# Patient Record
Sex: Male | Born: 1955 | Race: White | Hispanic: No | Marital: Married | Smoking: Never smoker
Health system: Southern US, Community
[De-identification: ages and names within clinical notes are randomized; demographics above are authoritative.]

## PROBLEM LIST (undated history)

## (undated) DIAGNOSIS — H33002 Unspecified retinal detachment with retinal break, left eye: Secondary | ICD-10-CM

## (undated) DIAGNOSIS — J309 Allergic rhinitis, unspecified: Secondary | ICD-10-CM

## (undated) DIAGNOSIS — N4340 Spermatocele of epididymis, unspecified: Secondary | ICD-10-CM

## (undated) DIAGNOSIS — Z87898 Personal history of other specified conditions: Secondary | ICD-10-CM

## (undated) DIAGNOSIS — H3320 Serous retinal detachment, unspecified eye: Secondary | ICD-10-CM

## (undated) DIAGNOSIS — T7840XA Allergy, unspecified, initial encounter: Secondary | ICD-10-CM

## (undated) DIAGNOSIS — I447 Left bundle-branch block, unspecified: Secondary | ICD-10-CM

## (undated) DIAGNOSIS — H269 Unspecified cataract: Secondary | ICD-10-CM

## (undated) DIAGNOSIS — Z9889 Other specified postprocedural states: Secondary | ICD-10-CM

## (undated) DIAGNOSIS — I2510 Atherosclerotic heart disease of native coronary artery without angina pectoris: Secondary | ICD-10-CM

## (undated) DIAGNOSIS — K1321 Leukoplakia of oral mucosa, including tongue: Secondary | ICD-10-CM

## (undated) DIAGNOSIS — M1990 Unspecified osteoarthritis, unspecified site: Secondary | ICD-10-CM

## (undated) DIAGNOSIS — M2021 Hallux rigidus, right foot: Secondary | ICD-10-CM

## (undated) DIAGNOSIS — M2020 Hallux rigidus, unspecified foot: Secondary | ICD-10-CM

## (undated) DIAGNOSIS — K219 Gastro-esophageal reflux disease without esophagitis: Secondary | ICD-10-CM

## (undated) DIAGNOSIS — Z86010 Personal history of colonic polyps: Principal | ICD-10-CM

## (undated) DIAGNOSIS — R931 Abnormal findings on diagnostic imaging of heart and coronary circulation: Secondary | ICD-10-CM

## (undated) DIAGNOSIS — R109 Unspecified abdominal pain: Secondary | ICD-10-CM

## (undated) DIAGNOSIS — H9191 Unspecified hearing loss, right ear: Secondary | ICD-10-CM

## (undated) DIAGNOSIS — I341 Nonrheumatic mitral (valve) prolapse: Secondary | ICD-10-CM

## (undated) DIAGNOSIS — R112 Nausea with vomiting, unspecified: Secondary | ICD-10-CM

## (undated) HISTORY — DX: Personal history of colonic polyps: Z86.010

## (undated) HISTORY — DX: Nonrheumatic mitral (valve) prolapse: I34.1

## (undated) HISTORY — DX: Left bundle-branch block, unspecified: I44.7

## (undated) HISTORY — PX: WISDOM TOOTH EXTRACTION: SHX21

## (undated) HISTORY — DX: Unspecified hearing loss, right ear: H91.91

## (undated) HISTORY — DX: Spermatocele of epididymis, unspecified: N43.40

## (undated) HISTORY — DX: Personal history of other specified conditions: Z87.898

## (undated) HISTORY — DX: Hallux rigidus, right foot: M20.21

## (undated) HISTORY — DX: Serous retinal detachment, unspecified eye: H33.20

## (undated) HISTORY — DX: Gastro-esophageal reflux disease without esophagitis: K21.9

## (undated) HISTORY — DX: Atherosclerotic heart disease of native coronary artery without angina pectoris: I25.10

## (undated) HISTORY — PX: RETINAL DETACHMENT SURGERY: SHX105

## (undated) HISTORY — DX: Abnormal findings on diagnostic imaging of heart and coronary circulation: R93.1

## (undated) HISTORY — PX: COLONOSCOPY: SHX174

## (undated) HISTORY — DX: Hallux rigidus, unspecified foot: M20.20

## (undated) HISTORY — PX: ORTHOPEDIC SURGERY: SHX850

## (undated) HISTORY — DX: Unspecified cataract: H26.9

## (undated) HISTORY — DX: Unspecified abdominal pain: R10.9

## (undated) HISTORY — DX: Allergy, unspecified, initial encounter: T78.40XA

## (undated) HISTORY — DX: Allergic rhinitis, unspecified: J30.9

## (undated) HISTORY — PX: UPPER GASTROINTESTINAL ENDOSCOPY: SHX188

## (undated) HISTORY — DX: Leukoplakia of oral mucosa, including tongue: K13.21

## (undated) HISTORY — DX: Unspecified osteoarthritis, unspecified site: M19.90

---

## 1998-01-12 ENCOUNTER — Emergency Department (HOSPITAL_COMMUNITY): Admission: EM | Admit: 1998-01-12 | Discharge: 1998-01-12 | Payer: Self-pay | Admitting: Emergency Medicine

## 1999-07-27 HISTORY — PX: OTHER SURGICAL HISTORY: SHX169

## 1999-12-04 ENCOUNTER — Encounter: Payer: Self-pay | Admitting: Oral and Maxillofacial Surgery

## 1999-12-09 ENCOUNTER — Observation Stay (HOSPITAL_COMMUNITY): Admission: RE | Admit: 1999-12-09 | Discharge: 1999-12-10 | Payer: Self-pay | Admitting: Oral and Maxillofacial Surgery

## 2003-08-18 ENCOUNTER — Emergency Department (HOSPITAL_COMMUNITY): Admission: AD | Admit: 2003-08-18 | Discharge: 2003-08-18 | Payer: Self-pay | Admitting: Internal Medicine

## 2005-02-01 ENCOUNTER — Ambulatory Visit (HOSPITAL_COMMUNITY): Admission: RE | Admit: 2005-02-01 | Discharge: 2005-02-01 | Payer: Self-pay | Admitting: Gastroenterology

## 2012-04-25 HISTORY — PX: OTHER SURGICAL HISTORY: SHX169

## 2012-05-08 ENCOUNTER — Encounter (INDEPENDENT_AMBULATORY_CARE_PROVIDER_SITE_OTHER): Payer: PRIVATE HEALTH INSURANCE | Admitting: Ophthalmology

## 2012-05-08 DIAGNOSIS — H43819 Vitreous degeneration, unspecified eye: Secondary | ICD-10-CM

## 2012-05-08 DIAGNOSIS — H2510 Age-related nuclear cataract, unspecified eye: Secondary | ICD-10-CM

## 2012-05-08 DIAGNOSIS — H33309 Unspecified retinal break, unspecified eye: Secondary | ICD-10-CM

## 2012-05-08 DIAGNOSIS — H35439 Paving stone degeneration of retina, unspecified eye: Secondary | ICD-10-CM

## 2012-05-22 ENCOUNTER — Ambulatory Visit (INDEPENDENT_AMBULATORY_CARE_PROVIDER_SITE_OTHER): Payer: PRIVATE HEALTH INSURANCE | Admitting: Ophthalmology

## 2012-05-22 DIAGNOSIS — H33309 Unspecified retinal break, unspecified eye: Secondary | ICD-10-CM

## 2012-09-22 ENCOUNTER — Ambulatory Visit (INDEPENDENT_AMBULATORY_CARE_PROVIDER_SITE_OTHER): Payer: PRIVATE HEALTH INSURANCE | Admitting: Ophthalmology

## 2012-10-04 ENCOUNTER — Ambulatory Visit (INDEPENDENT_AMBULATORY_CARE_PROVIDER_SITE_OTHER): Payer: PRIVATE HEALTH INSURANCE | Admitting: Ophthalmology

## 2012-10-04 DIAGNOSIS — H2510 Age-related nuclear cataract, unspecified eye: Secondary | ICD-10-CM

## 2012-10-04 DIAGNOSIS — H33309 Unspecified retinal break, unspecified eye: Secondary | ICD-10-CM

## 2012-10-04 DIAGNOSIS — H43819 Vitreous degeneration, unspecified eye: Secondary | ICD-10-CM

## 2013-01-12 ENCOUNTER — Encounter: Payer: Self-pay | Admitting: Internal Medicine

## 2013-02-20 ENCOUNTER — Ambulatory Visit (INDEPENDENT_AMBULATORY_CARE_PROVIDER_SITE_OTHER): Payer: 59 | Admitting: Internal Medicine

## 2013-02-20 ENCOUNTER — Encounter: Payer: Self-pay | Admitting: Internal Medicine

## 2013-02-20 VITALS — BP 104/72 | HR 64 | Ht 71.5 in | Wt 168.8 lb

## 2013-02-20 DIAGNOSIS — K219 Gastro-esophageal reflux disease without esophagitis: Secondary | ICD-10-CM

## 2013-02-20 NOTE — Progress Notes (Addendum)
Subjective:    Patient ID: Willie Hernandez, male    DOB: 11/06/1955, 57 y.o.   MRN: [id number]  HPI The patient is here at the request of Dr. Valentina Lucks because of a > 5 year history of heartburn and reflux. He has used PPI and H2 blockers over the years and is able to reduce symptoms with dietary modifications as well. He does have frequent symptoms at times over the years. He does not have dysphagia or unintentional weight loss.   Allergies  Allergen Reactions  . Doxycycline Hyclate     Sores in mouth  . Sulfa Antibiotics Hives    flushing   Outpatient Prescriptions Prior to Visit  Medication Sig Dispense Refill  . cholecalciferol (VITAMIN D) 1000 UNITS tablet Take 1,000 Units by mouth daily.      . fexofenadine (ALLEGRA) 60 MG tablet Take 60 mg by mouth 2 (two) times daily.      . Flaxseed, Linseed, 1000 MG CAPS Take by mouth.      . Multiple Vitamin (MULTIVITAMIN) tablet Take 1 tablet by mouth daily.      . pantoprazole (PROTONIX) 40 MG tablet Take 40 mg by mouth daily as needed.      . ranitidine (ZANTAC) 150 MG tablet Take 150 mg by mouth daily.      . fluticasone (FLONASE) 50 MCG/ACT nasal spray Place 2 sprays into the nose daily.       No facility-administered medications prior to visit.   Past Medical History  Diagnosis Date  . GERD (gastroesophageal reflux disease)   . Allergic rhinitis   . Mitral valve prolapse     mild mitral regurgitation  . Hearing loss in right ear     noise exposure  . Spermatocele     Right, Dr. Isabel Caprice  . Hallux rigidus     with heel cord tightness, Dr. Lajoyce Corners   Past Surgical History  Procedure Laterality Date  . Corrective jaw surgery  2001    for overbite  . Laser retinal tears os  04/2012   History   Social History  . Marital Status: Married    Spouse Name: Elease Hashimoto    Number of Children: 2  . Years of Education: JD   Occupational History  . 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    Social History Main Topics  . Smoking status: Never Smoker    . Smokeless tobacco: Never Used  . Alcohol Use: Yes     Comment: occ.  . Drug Use: No  .       Family History  Problem Relation Age of Onset  . Atrial fibrillation Father   . Sudden death Father   . Congestive Heart Failure Mother   . Alzheimer's disease Mother   . Esophageal cancer      uncle  . Colon cancer Paternal Grandfather     Review of Systems + seasonal allergies and decreased hearing in right ear All other ROS negative    Objective:   Physical Exam General:  NAD Eyes:   anicteric Lungs:  clear Heart:  S1S2 no rubs, murmurs or gallops Abdomen:  soft and nontender  Data Reviewed:   PCP notes Says he is due for colonoscopy in 2016 - records requested patient thinks last colonoscopy < 5 years ago and no hx polyps  Records review shows last in 2006 and was normal Will need recall for 01/2015 Assessment & Plan:  GERD (gastroesophageal reflux disease)   1. Given that he is a white male  in 6th decade and has chronic GERD screening for Barrett's is reasonable so will perform EGD 2. The risks and benefits as well as alternatives of endoscopic procedure(s) have been discussed and reviewed. All questions answered. The patient agrees to proceed.  I appreciate the opportunity to care for this patient.  ZO:[email or web address],WUJW Jomarie Longs, MD

## 2013-02-20 NOTE — Patient Instructions (Addendum)
You have been scheduled for an endoscopy with propofol. Please follow written instructions given to you at your visit today. If you use inhalers (even only as needed), please bring them with you on the day of your procedure. Your physician has requested that you go to www.[email or web address].com and enter the access code given to you at your visit today. This web site gives a general overview about your procedure. However, you should still follow specific instructions given to you by our office regarding your preparation for the procedure.  I appreciate the opportunity to care for you.  

## 2013-02-21 ENCOUNTER — Encounter: Payer: Self-pay | Admitting: Internal Medicine

## 2013-03-23 ENCOUNTER — Encounter: Payer: Self-pay | Admitting: Internal Medicine

## 2013-03-23 ENCOUNTER — Ambulatory Visit (AMBULATORY_SURGERY_CENTER): Payer: 59 | Admitting: Internal Medicine

## 2013-03-23 VITALS — BP 120/57 | HR 48 | Temp 98.3°F | Resp 23 | Ht 71.0 in | Wt 168.0 lb

## 2013-03-23 DIAGNOSIS — K219 Gastro-esophageal reflux disease without esophagitis: Secondary | ICD-10-CM

## 2013-03-23 MED ORDER — SODIUM CHLORIDE 0.9 % IV SOLN: 500.0000 mL | INTRAVENOUS | Status: DC

## 2013-03-23 NOTE — Progress Notes (Signed)
Called to room to assist during endoscopic procedure.  Patient ID and intended procedure confirmed with present staff. Received instructions for my participation in the procedure from the performing physician.  

## 2013-03-23 NOTE — Patient Instructions (Addendum)
YOU HAD AN ENDOSCOPIC PROCEDURE TODAY AT THE [facility]: Refer to the procedure report that was given to you for any specific questions about what was found during the examination.  If the procedure report does not answer your questions, please call your gastroenterologist to clarify.  If you requested that your care partner not be given the details of your procedure findings, then the procedure report has been included in a sealed envelope for you to review at your convenience later.  YOU SHOULD EXPECT: Some feelings of bloating in the abdomen. Passage of more gas than usual.  Walking can help get rid of the air that was put into your GI tract during the procedure and reduce the bloating. If you had a lower endoscopy (such as a colonoscopy or flexible sigmoidoscopy) you may notice spotting of blood in your stool or on the toilet paper. If you underwent a bowel prep for your procedure, then you may not have a normal bowel movement for a few days.  DIET: Your first meal following the procedure should be a light meal and then it is ok to progress to your normal diet.  A half-sandwich or bowl of soup is an example of a good first meal.  Heavy or fried foods are harder to digest and may make you feel nauseous or bloated.  Likewise meals heavy in dairy and vegetables can cause extra gas to form and this can also increase the bloating.  Drink plenty of fluids but you should avoid alcoholic beverages for 24 hours.  ACTIVITY: Your care partner should take you home directly after the procedure.  You should plan to take it easy, moving slowly for the rest of the day.  You can resume normal activity the day after the procedure however you should NOT DRIVE or use heavy machinery for 24 hours (because of the sedation medicines used during the test).    SYMPTOMS TO REPORT IMMEDIATELY: A gastroenterologist can be reached at any hour.  During normal business hours, 8:30 AM to 5:00 PM Monday through  Friday, call [phone].  After hours and on weekends, please call the GI answering service at [phone] who will take a message and have the physician on call contact you.   Following upper endoscopy (EGD)  Vomiting of blood or coffee ground material  New chest pain or pain under the shoulder blades  Painful or persistently difficult swallowing  New shortness of breath  Fever of 100F or higher  Black, tarry-looking stools  FOLLOW UP: If any biopsies were taken you will be contacted by phone or by letter within the next 1-3 weeks.  Call your gastroenterologist if you have not heard about the biopsies in 3 weeks.  Our staff will call the home number listed on your records the next business day following your procedure to check on you and address any questions or concerns that you may have at that time regarding the information given to you following your procedure. This is a courtesy call and so if there is no answer at the home number and we have not heard from you through the emergency physician on call, we will assume that you have returned to your regular daily activities without incident.  SIGNATURES/CONFIDENTIALITY: You and/or your care partner have signed paperwork which will be entered into your electronic medical record.  These signatures attest to the fact that that the information above on your After Visit Summary has been reviewed and is understood.  Full responsibility of the confidentiality of this discharge information lies with you and/or your care-partner.There was a tiny (2-3 mm) area of possible Barrett's esophagus. The alternative is this is just the way you are built. Whatever the case the cancer risk would be extremely small. If there is Barrett's tissue then I would recommend rechecking in 3-5 years.  Remainder of exam normal.  I will call with the results when they return - you should hear by late next week.  I appreciate the opportunity to care for you. Iva Boop, MD, [facility]

## 2013-03-23 NOTE — Op Note (Signed)
[facility] [address] [place], [address]   ENDOSCOPY PROCEDURE REPORT  PATIENT: Willie, Hernandez  MR#: [id number] BIRTHDATE: [birth year] , 57  yrs. old GENDER: Male ENDOSCOPIST: Iva Boop, MD, [facility] PROCEDURE DATE:  03/23/2013 PROCEDURE:  EGD w/ biopsy ASA CLASS:     Class II INDICATIONS:  Heartburn.   Barrett's screening. MEDICATIONS: propofol (Diprivan) 150mg  IV, MAC sedation, administered by CRNA, and These medications were titrated to patient response per physician's verbal order TOPICAL ANESTHETIC: Cetacaine Spray  DESCRIPTION OF PROCEDURE: After the risks benefits and alternatives of the procedure were thoroughly explained, informed consent was obtained.  The LB [id number] endoscope was introduced through the mouth and advanced to the second portion of the duodenum. Without limitations.  The instrument was slowly withdrawn as the mucosa was fully examined.        ESOPHAGUS: There was evidence of suspected Barrett's esophagus at the gastroesophageal junction. 2-3 mm tongue vs. irregular Z-line (at 41 cm). Multiple biopsies were performed using cold forceps. Sample sent for histology.  The remainder of the upper endoscopy exam was otherwise normal. Retroflexed views revealed ? Barrett's.     The scope was then withdrawn from the patient and the procedure completed.  COMPLICATIONS: There were no complications. ENDOSCOPIC IMPRESSION: 1.   There was evidence of suspected Barrett's esophagus; multiple biopsies 2.   The remainder of the upper endoscopy exam was otherwise normal  RECOMMENDATIONS: Office will call with results   eSigned:  Iva Boop, MD, [facility] 03/23/2013 4:43 PM   VO:ZDGU Valentina Lucks, MD and The Patient

## 2013-03-23 NOTE — Progress Notes (Signed)
Patient did not have preoperative order for IV antibiotic SSI prophylaxis. (G8918)  Patient did not experience any of the following events: a burn prior to discharge; a fall within the facility; wrong site/side/patient/procedure/implant event; or a hospital transfer or hospital admission upon discharge from the facility. (G8907)  

## 2013-03-27 ENCOUNTER — Telehealth: Payer: Self-pay | Admitting: *Deleted

## 2013-03-27 NOTE — Telephone Encounter (Signed)
  Follow up Call-  Call back number 03/23/2013  Post procedure Call Back phone  # [phone]  Permission to leave phone message Yes     Patient questions:  Message left to call us if necessary.

## 2013-04-04 ENCOUNTER — Other Ambulatory Visit: Payer: Self-pay | Admitting: Internal Medicine

## 2013-04-04 MED ORDER — PANTOPRAZOLE SODIUM 20 MG PO TBEC: 20.0000 mg | DELAYED_RELEASE_TABLET | Freq: Every day | ORAL | Status: DC

## 2013-04-04 NOTE — Progress Notes (Signed)
Quick Note:  Results given to patient - mild reflux changes - no Barrett's Will stay on PPI to try to go to 20 mg daily pantoprazole No recall/letter ______

## 2014-11-08 ENCOUNTER — Encounter (INDEPENDENT_AMBULATORY_CARE_PROVIDER_SITE_OTHER): Payer: 59 | Admitting: Ophthalmology

## 2014-11-08 DIAGNOSIS — H43813 Vitreous degeneration, bilateral: Secondary | ICD-10-CM | POA: Diagnosis not present

## 2014-11-08 DIAGNOSIS — H33302 Unspecified retinal break, left eye: Secondary | ICD-10-CM

## 2014-11-08 DIAGNOSIS — H2513 Age-related nuclear cataract, bilateral: Secondary | ICD-10-CM | POA: Diagnosis not present

## 2015-04-01 ENCOUNTER — Encounter: Payer: Self-pay | Admitting: Internal Medicine

## 2016-03-25 ENCOUNTER — Encounter: Payer: Self-pay | Admitting: Internal Medicine

## 2016-06-28 ENCOUNTER — Encounter: Payer: Self-pay | Admitting: Internal Medicine

## 2016-08-02 DIAGNOSIS — K1329 Other disturbances of oral epithelium, including tongue: Secondary | ICD-10-CM | POA: Diagnosis not present

## 2016-08-05 DIAGNOSIS — K1329 Other disturbances of oral epithelium, including tongue: Secondary | ICD-10-CM | POA: Diagnosis not present

## 2016-08-27 ENCOUNTER — Ambulatory Visit (AMBULATORY_SURGERY_CENTER): Payer: Self-pay

## 2016-08-27 VITALS — Ht 71.0 in | Wt 155.4 lb

## 2016-08-27 DIAGNOSIS — Z1211 Encounter for screening for malignant neoplasm of colon: Secondary | ICD-10-CM

## 2016-08-27 NOTE — Progress Notes (Signed)
No allergies to eggs or soy No diet meds No home oxygen No past problems with anesthesia  Declined emmi 

## 2016-08-30 ENCOUNTER — Encounter: Payer: Self-pay | Admitting: Internal Medicine

## 2016-09-06 ENCOUNTER — Encounter (HOSPITAL_COMMUNITY): Payer: Self-pay | Admitting: *Deleted

## 2016-09-06 ENCOUNTER — Encounter (INDEPENDENT_AMBULATORY_CARE_PROVIDER_SITE_OTHER): Payer: 59 | Admitting: Ophthalmology

## 2016-09-06 DIAGNOSIS — H33303 Unspecified retinal break, bilateral: Secondary | ICD-10-CM | POA: Diagnosis not present

## 2016-09-06 DIAGNOSIS — H338 Other retinal detachments: Secondary | ICD-10-CM | POA: Diagnosis not present

## 2016-09-06 DIAGNOSIS — H43813 Vitreous degeneration, bilateral: Secondary | ICD-10-CM

## 2016-09-06 DIAGNOSIS — H2513 Age-related nuclear cataract, bilateral: Secondary | ICD-10-CM | POA: Diagnosis not present

## 2016-09-06 NOTE — H&P (Signed)
Willie Hernandez is an 61 y.o. male.   Chief Complaint: loss of side vision left eye HPI: 4 days of flashes, floaters and loss of vision left eye  Past Medical History:  Diagnosis Date  . Allergic rhinitis   . GERD (gastroesophageal reflux disease)   . Hallux rigidus    with heel cord tightness, Dr. Sharol Given  . Hearing loss in right ear    noise exposure  . Mitral valve prolapse    mild mitral regurgitation  . Spermatocele    Right, Dr. Risa Grill    Past Surgical History:  Procedure Laterality Date  . COLONOSCOPY  1997, 2006  . corrective jaw surgery  2001   for overbite  . laser retinal tears OS  04/2012    Family History  Problem Relation Age of Onset  . Atrial fibrillation Father   . Sudden death Father   . Congestive Heart Failure Mother   . Alzheimer's disease Mother   . Esophageal cancer      uncle  . Colon cancer Paternal Grandfather    Social History:  reports that he has never smoked. He has never used smokeless tobacco. He reports that he drinks alcohol. He reports that he does not use drugs.  Allergies:  Allergies  Allergen Reactions  . Doxycycline Hyclate     Sores in mouth  . Sulfa Antibiotics Hives    flushing    No prescriptions prior to admission.    Review of systems otherwise negative  There were no vitals taken for this visit.  Physical exam: Mental status: oriented x3. Eyes: See eye exam associated with this date of surgery in media tab.  Scanned in by scanning center Ears, Nose, Throat: within normal limits Neck: Within Normal limits General: within normal limits Chest: Within normal limits Breast: deferred Heart: Within normal limits Abdomen: Within normal limits GU: deferred Extremities: within normal limits Skin: within normal limits  Assessment/Plan Rhegmatogenous retinal detachment left eye Plan: To [facility] for Scleral buckle, laser, gas injection left eye.  Possible pars plana vitrectomy left eye  Willie Hernandez 09/06/2016, 12:43 PM

## 2016-09-06 NOTE — Progress Notes (Signed)
Pt denies SOB, chest pain, and being under the care of a cardiologist. Pt denies having a stress test, echo and cardiac cath. Pt denies having an EKG and chest x ray. Pt denies having recent labs. Pt made aware to stop taking Aspirin, vitamins, fish oil and herbal medications. Do not take any NSAIDs ie: Ibuprofen, Advil, Naproxen, BC and Goody Powder or any medication containing Aspirin. Pt verbalized understanding of all pre-op instructions.

## 2016-09-07 ENCOUNTER — Ambulatory Visit (HOSPITAL_COMMUNITY): Payer: 59 | Admitting: Anesthesiology

## 2016-09-07 ENCOUNTER — Encounter (HOSPITAL_COMMUNITY): Payer: Self-pay | Admitting: *Deleted

## 2016-09-07 ENCOUNTER — Ambulatory Visit (HOSPITAL_COMMUNITY)
Admission: RE | Admit: 2016-09-07 | Discharge: 2016-09-08 | Disposition: A | Discharge status: Home / Self Care | Payer: 59 | Source: Ambulatory Visit | Attending: Ophthalmology | Admitting: Ophthalmology

## 2016-09-07 ENCOUNTER — Telehealth: Payer: Self-pay | Admitting: Internal Medicine

## 2016-09-07 ENCOUNTER — Encounter (HOSPITAL_COMMUNITY)
Admission: RE | Disposition: A | Discharge status: Home / Self Care | Payer: Self-pay | Source: Ambulatory Visit | Attending: Ophthalmology

## 2016-09-07 DIAGNOSIS — H33331 Multiple defects of retina without detachment, right eye: Secondary | ICD-10-CM | POA: Diagnosis present

## 2016-09-07 DIAGNOSIS — Z882 Allergy status to sulfonamides status: Secondary | ICD-10-CM | POA: Diagnosis not present

## 2016-09-07 DIAGNOSIS — I341 Nonrheumatic mitral (valve) prolapse: Secondary | ICD-10-CM | POA: Insufficient documentation

## 2016-09-07 DIAGNOSIS — H33002 Unspecified retinal detachment with retinal break, left eye: Secondary | ICD-10-CM | POA: Diagnosis not present

## 2016-09-07 DIAGNOSIS — I340 Nonrheumatic mitral (valve) insufficiency: Secondary | ICD-10-CM | POA: Insufficient documentation

## 2016-09-07 DIAGNOSIS — K219 Gastro-esophageal reflux disease without esophagitis: Secondary | ICD-10-CM | POA: Diagnosis not present

## 2016-09-07 DIAGNOSIS — Z881 Allergy status to other antibiotic agents status: Secondary | ICD-10-CM | POA: Diagnosis not present

## 2016-09-07 DIAGNOSIS — H43392 Other vitreous opacities, left eye: Secondary | ICD-10-CM | POA: Insufficient documentation

## 2016-09-07 DIAGNOSIS — H3322 Serous retinal detachment, left eye: Secondary | ICD-10-CM | POA: Diagnosis not present

## 2016-09-07 DIAGNOSIS — H33301 Unspecified retinal break, right eye: Secondary | ICD-10-CM | POA: Diagnosis not present

## 2016-09-07 DIAGNOSIS — H33021 Retinal detachment with multiple breaks, right eye: Secondary | ICD-10-CM | POA: Insufficient documentation

## 2016-09-07 HISTORY — PX: SCLERAL BUCKLE WITH CRYO: SHX5341

## 2016-09-07 HISTORY — DX: Other specified postprocedural states: Z98.890

## 2016-09-07 HISTORY — DX: Other specified postprocedural states: R11.2

## 2016-09-07 HISTORY — PX: GAS INSERTION: SHX5336

## 2016-09-07 HISTORY — PX: LASER PHOTO ABLATION: SHX5942

## 2016-09-07 HISTORY — DX: Unspecified retinal detachment with retinal break, left eye: H33.002

## 2016-09-07 SURGERY — LASER PHOTO ABLATION
Anesthesia: General | Site: Eye | Laterality: Left

## 2016-09-07 MED ORDER — PROPOFOL 10 MG/ML IV BOLUS
INTRAVENOUS | Status: AC
  Filled 2016-09-07: qty 20

## 2016-09-07 MED ORDER — EPINEPHRINE PF 1 MG/ML IJ SOLN
INTRAMUSCULAR | Status: AC
  Filled 2016-09-07: qty 1

## 2016-09-07 MED ORDER — MORPHINE SULFATE (PF) 2 MG/ML IV SOLN
1.0000 mg | INTRAVENOUS | Status: AC | PRN
  Administered 2016-09-07 (×2): 2 mg via INTRAVENOUS
  Filled 2016-09-07 (×3): qty 1

## 2016-09-07 MED ORDER — CYCLOPENTOLATE HCL 1 % OP SOLN
1.0000 [drp] | OPHTHALMIC | Status: AC | PRN
  Administered 2016-09-07 (×3): 1 [drp] via OPHTHALMIC
  Filled 2016-09-07: qty 2

## 2016-09-07 MED ORDER — BSS IO SOLN
INTRAOCULAR | Status: AC
  Filled 2016-09-07: qty 15

## 2016-09-07 MED ORDER — STERILE WATER FOR IRRIGATION IR SOLN
Status: DC | PRN
  Administered 2016-09-07: 200 mL

## 2016-09-07 MED ORDER — POLYMYXIN B SULFATE 500000 UNITS IJ SOLR
INTRAMUSCULAR | Status: AC
  Filled 2016-09-07: qty 500000

## 2016-09-07 MED ORDER — PROPOFOL 10 MG/ML IV BOLUS
INTRAVENOUS | Status: DC | PRN
  Administered 2016-09-07: 140 mg via INTRAVENOUS

## 2016-09-07 MED ORDER — CHLORHEXIDINE GLUCONATE 0.12 % MT SOLN: 15.0000 mL | Freq: Two times a day (BID) | OROMUCOSAL | Status: DC

## 2016-09-07 MED ORDER — TRIAMCINOLONE ACETONIDE 40 MG/ML IJ SUSP
INTRAMUSCULAR | Status: AC
  Filled 2016-09-07: qty 5

## 2016-09-07 MED ORDER — ROCURONIUM BROMIDE 50 MG/5ML IV SOSY
PREFILLED_SYRINGE | INTRAVENOUS | Status: AC
  Filled 2016-09-07: qty 5

## 2016-09-07 MED ORDER — BSS IO SOLN
INTRAOCULAR | Status: DC | PRN
  Administered 2016-09-07 (×2): 15 mL via INTRAOCULAR

## 2016-09-07 MED ORDER — BRIMONIDINE TARTRATE 0.2 % OP SOLN
1.0000 [drp] | Freq: Two times a day (BID) | OPHTHALMIC | Status: DC
  Filled 2016-09-07: qty 5

## 2016-09-07 MED ORDER — HYDROCODONE-ACETAMINOPHEN 5-325 MG PO TABS
1.0000 | ORAL_TABLET | ORAL | Status: DC | PRN
  Filled 2016-09-07: qty 1

## 2016-09-07 MED ORDER — SUGAMMADEX SODIUM 200 MG/2ML IV SOLN
INTRAVENOUS | Status: AC
  Filled 2016-09-07: qty 2

## 2016-09-07 MED ORDER — MIDAZOLAM HCL 5 MG/5ML IJ SOLN
INTRAMUSCULAR | Status: DC | PRN
  Administered 2016-09-07: 2 mg via INTRAVENOUS

## 2016-09-07 MED ORDER — FENTANYL CITRATE (PF) 100 MCG/2ML IJ SOLN
INTRAMUSCULAR | Status: AC
  Filled 2016-09-07: qty 2

## 2016-09-07 MED ORDER — BACITRACIN-POLYMYXIN B 500-10000 UNIT/GM OP OINT
TOPICAL_OINTMENT | OPHTHALMIC | Status: AC
  Filled 2016-09-07: qty 3.5

## 2016-09-07 MED ORDER — BUPIVACAINE HCL (PF) 0.75 % IJ SOLN
INTRAMUSCULAR | Status: AC
  Filled 2016-09-07: qty 10

## 2016-09-07 MED ORDER — HYDROMORPHONE HCL 1 MG/ML IJ SOLN
INTRAMUSCULAR | Status: AC
  Filled 2016-09-07: qty 1

## 2016-09-07 MED ORDER — FAMOTIDINE 10 MG PO TABS: 10.0000 mg | ORAL_TABLET | Freq: Every day | ORAL | Status: DC | PRN

## 2016-09-07 MED ORDER — GATIFLOXACIN 0.5 % OP SOLN
1.0000 [drp] | OPHTHALMIC | Status: AC | PRN
  Administered 2016-09-07 (×3): 1 [drp] via OPHTHALMIC
  Filled 2016-09-07: qty 2.5

## 2016-09-07 MED ORDER — ONDANSETRON HCL 4 MG/2ML IJ SOLN
INTRAMUSCULAR | Status: DC | PRN
  Administered 2016-09-07: 4 mg via INTRAVENOUS

## 2016-09-07 MED ORDER — SUGAMMADEX SODIUM 200 MG/2ML IV SOLN
INTRAVENOUS | Status: DC | PRN
  Administered 2016-09-07: 140 mg via INTRAVENOUS

## 2016-09-07 MED ORDER — 0.9 % SODIUM CHLORIDE (POUR BTL) OPTIME
TOPICAL | Status: DC | PRN
  Administered 2016-09-07: 200 mL

## 2016-09-07 MED ORDER — STERILE WATER FOR INJECTION IJ SOLN
INTRAMUSCULAR | Status: DC | PRN
  Administered 2016-09-07: 20 mL

## 2016-09-07 MED ORDER — IBUPROFEN 200 MG PO TABS: 200.0000 mg | ORAL_TABLET | Freq: Every day | ORAL | Status: DC | PRN

## 2016-09-07 MED ORDER — SODIUM CHLORIDE 0.9 % IJ SOLN
INTRAMUSCULAR | Status: AC
  Filled 2016-09-07: qty 10

## 2016-09-07 MED ORDER — LATANOPROST 0.005 % OP SOLN
1.0000 [drp] | Freq: Every day | OPHTHALMIC | Status: DC
  Filled 2016-09-07: qty 2.5

## 2016-09-07 MED ORDER — FENTANYL CITRATE (PF) 100 MCG/2ML IJ SOLN
INTRAMUSCULAR | Status: DC | PRN
  Administered 2016-09-07: 50 ug via INTRAVENOUS

## 2016-09-07 MED ORDER — TEMAZEPAM 15 MG PO CAPS: 15.0000 mg | ORAL_CAPSULE | Freq: Every evening | ORAL | Status: DC | PRN

## 2016-09-07 MED ORDER — ATROPINE SULFATE 0.4 MG/ML IV SOSY
PREFILLED_SYRINGE | INTRAVENOUS | Status: DC | PRN
  Administered 2016-09-07: .1 mg via INTRAVENOUS
  Administered 2016-09-07: .2 mg via INTRAVENOUS

## 2016-09-07 MED ORDER — PHENYLEPHRINE 40 MCG/ML (10ML) SYRINGE FOR IV PUSH (FOR BLOOD PRESSURE SUPPORT)
PREFILLED_SYRINGE | INTRAVENOUS | Status: AC
  Filled 2016-09-07: qty 10

## 2016-09-07 MED ORDER — GATIFLOXACIN 0.5 % OP SOLN
1.0000 [drp] | Freq: Four times a day (QID) | OPHTHALMIC | Status: DC
  Filled 2016-09-07: qty 2.5

## 2016-09-07 MED ORDER — CEFTAZIDIME 1 G IJ SOLR
INTRAMUSCULAR | Status: AC
  Filled 2016-09-07: qty 1

## 2016-09-07 MED ORDER — FLUTICASONE PROPIONATE 50 MCG/ACT NA SUSP
1.0000 | Freq: Every day | NASAL | Status: DC | PRN
  Filled 2016-09-07: qty 16

## 2016-09-07 MED ORDER — MAGNESIUM HYDROXIDE 400 MG/5ML PO SUSP
15.0000 mL | Freq: Four times a day (QID) | ORAL | Status: DC | PRN
Start: 2016-09-07 — End: 2016-09-08

## 2016-09-07 MED ORDER — TROPICAMIDE 1 % OP SOLN
1.0000 [drp] | OPHTHALMIC | Status: AC | PRN
  Administered 2016-09-07 (×3): 1 [drp] via OPHTHALMIC
  Filled 2016-09-07 (×2): qty 15

## 2016-09-07 MED ORDER — ONDANSETRON HCL 4 MG/2ML IJ SOLN
INTRAMUSCULAR | Status: AC
  Filled 2016-09-07: qty 2

## 2016-09-07 MED ORDER — ACETAMINOPHEN 325 MG PO TABS
325.0000 mg | ORAL_TABLET | ORAL | Status: DC | PRN
  Administered 2016-09-08: 650 mg via ORAL
  Filled 2016-09-07: qty 2

## 2016-09-07 MED ORDER — DORZOLAMIDE HCL 2 % OP SOLN
1.0000 [drp] | Freq: Three times a day (TID) | OPHTHALMIC | Status: DC
  Filled 2016-09-07: qty 10

## 2016-09-07 MED ORDER — ACETAZOLAMIDE SODIUM 500 MG IJ SOLR
500.0000 mg | Freq: Once | INTRAMUSCULAR | Status: AC
  Administered 2016-09-08: 500 mg via INTRAVENOUS
  Filled 2016-09-07: qty 500

## 2016-09-07 MED ORDER — PHENYLEPHRINE HCL 2.5 % OP SOLN
1.0000 [drp] | OPHTHALMIC | Status: AC | PRN
  Administered 2016-09-07 (×3): 1 [drp] via OPHTHALMIC
  Filled 2016-09-07: qty 2

## 2016-09-07 MED ORDER — CEFAZOLIN SODIUM-DEXTROSE 2-4 GM/100ML-% IV SOLN
2.0000 g | INTRAVENOUS | Status: AC
  Administered 2016-09-07: 2 g via INTRAVENOUS
  Filled 2016-09-07: qty 100

## 2016-09-07 MED ORDER — LIDOCAINE 2% (20 MG/ML) 5 ML SYRINGE
INTRAMUSCULAR | Status: DC | PRN
  Administered 2016-09-07: 100 mg via INTRAVENOUS

## 2016-09-07 MED ORDER — HYDROMORPHONE HCL 1 MG/ML IJ SOLN
0.2500 mg | INTRAMUSCULAR | Status: DC | PRN
Start: 2016-09-07 — End: 2016-09-07
  Administered 2016-09-07 (×2): 0.5 mg via INTRAVENOUS

## 2016-09-07 MED ORDER — SODIUM HYALURONATE 10 MG/ML IO SOLN
INTRAOCULAR | Status: AC
  Filled 2016-09-07: qty 0.85

## 2016-09-07 MED ORDER — BUPIVACAINE HCL (PF) 0.75 % IJ SOLN
INTRAMUSCULAR | Status: DC | PRN
  Administered 2016-09-07: 10 mL

## 2016-09-07 MED ORDER — MIDAZOLAM HCL 2 MG/2ML IJ SOLN
INTRAMUSCULAR | Status: AC
  Filled 2016-09-07: qty 2

## 2016-09-07 MED ORDER — STERILE WATER FOR INJECTION IJ SOLN
INTRAMUSCULAR | Status: AC
  Filled 2016-09-07: qty 20

## 2016-09-07 MED ORDER — LIDOCAINE 2% (20 MG/ML) 5 ML SYRINGE
INTRAMUSCULAR | Status: AC
  Filled 2016-09-07: qty 5

## 2016-09-07 MED ORDER — PREDNISOLONE ACETATE 1 % OP SUSP
1.0000 [drp] | Freq: Four times a day (QID) | OPHTHALMIC | Status: DC
  Filled 2016-09-07: qty 5

## 2016-09-07 MED ORDER — ONDANSETRON HCL 4 MG/2ML IJ SOLN
4.0000 mg | Freq: Four times a day (QID) | INTRAMUSCULAR | Status: DC
  Administered 2016-09-07 – 2016-09-08 (×3): 4 mg via INTRAVENOUS
  Filled 2016-09-07 (×2): qty 2

## 2016-09-07 MED ORDER — SODIUM CHLORIDE 0.45 % IV SOLN
INTRAVENOUS | Status: DC
  Administered 2016-09-07: 19:00:00 via INTRAVENOUS

## 2016-09-07 MED ORDER — BSS PLUS IO SOLN
INTRAOCULAR | Status: AC
  Filled 2016-09-07: qty 500

## 2016-09-07 MED ORDER — ATROPINE SULFATE 1 % OP SOLN
OPHTHALMIC | Status: DC | PRN
  Administered 2016-09-07: 1 [drp] via OPHTHALMIC

## 2016-09-07 MED ORDER — DEXAMETHASONE SODIUM PHOSPHATE 10 MG/ML IJ SOLN
INTRAMUSCULAR | Status: DC | PRN
  Administered 2016-09-07: 10 mg

## 2016-09-07 MED ORDER — HEMOSTATIC AGENTS (NO CHARGE) OPTIME
TOPICAL | Status: DC | PRN
  Administered 2016-09-07: 1 via TOPICAL

## 2016-09-07 MED ORDER — LACTATED RINGERS IV SOLN: INTRAVENOUS | Status: DC | PRN

## 2016-09-07 MED ORDER — BACITRACIN-POLYMYXIN B 500-10000 UNIT/GM OP OINT
TOPICAL_OINTMENT | OPHTHALMIC | Status: DC | PRN
  Administered 2016-09-07: 1 via OPHTHALMIC

## 2016-09-07 MED ORDER — BACITRACIN-POLYMYXIN B 500-10000 UNIT/GM OP OINT
1.0000 "application " | TOPICAL_OINTMENT | Freq: Three times a day (TID) | OPHTHALMIC | Status: DC
  Filled 2016-09-07: qty 3.5

## 2016-09-07 MED ORDER — ATROPINE SULFATE 1 % OP SOLN
OPHTHALMIC | Status: AC
  Filled 2016-09-07: qty 5

## 2016-09-07 MED ORDER — TETRACAINE HCL 0.5 % OP SOLN
2.0000 [drp] | Freq: Once | OPHTHALMIC | Status: DC
  Filled 2016-09-07: qty 2

## 2016-09-07 MED ORDER — PROMETHAZINE HCL 25 MG/ML IJ SOLN: 6.2500 mg | INTRAMUSCULAR | Status: DC | PRN

## 2016-09-07 MED ORDER — SODIUM CHLORIDE 0.9 % IV SOLN
INTRAVENOUS | Status: DC | PRN
  Administered 2016-09-07 (×3): via INTRAVENOUS

## 2016-09-07 MED ORDER — EPHEDRINE SULFATE 50 MG/ML IJ SOLN
INTRAMUSCULAR | Status: DC | PRN
  Administered 2016-09-07: 15 mg via INTRAVENOUS
  Administered 2016-09-07: 10 mg via INTRAVENOUS

## 2016-09-07 MED ORDER — ROCURONIUM BROMIDE 10 MG/ML (PF) SYRINGE
PREFILLED_SYRINGE | INTRAVENOUS | Status: DC | PRN
  Administered 2016-09-07: 50 mg via INTRAVENOUS
  Administered 2016-09-07 (×2): 20 mg via INTRAVENOUS
  Administered 2016-09-07: 10 mg via INTRAVENOUS

## 2016-09-07 MED ORDER — DEXAMETHASONE SODIUM PHOSPHATE 10 MG/ML IJ SOLN
INTRAMUSCULAR | Status: AC
  Filled 2016-09-07: qty 1

## 2016-09-07 SURGICAL SUPPLY — 74 items
APPLICATOR DR MATTHEWS STRL (MISCELLANEOUS) ×28 IMPLANT
BLADE EYE CATARACT 19 1.4 BEAV (BLADE) ×4 IMPLANT
BLADE MVR KNIFE 19G (BLADE) IMPLANT
CANNULA ANT CHAM MAIN (OPHTHALMIC RELATED) IMPLANT
CANNULA DUAL BORE 23G (CANNULA) IMPLANT
CORDS BIPOLAR (ELECTRODE) IMPLANT
COTTONBALL LRG STERILE PKG (GAUZE/BANDAGES/DRESSINGS) ×12 IMPLANT
COVER MAYO STAND STRL (DRAPES) IMPLANT
COVER SURGICAL LIGHT HANDLE (MISCELLANEOUS) ×4 IMPLANT
DRAPE INCISE 51X51 W/FILM STRL (DRAPES) IMPLANT
DRAPE OPHTHALMIC 77X100 STRL (CUSTOM PROCEDURE TRAY) ×4 IMPLANT
ERASER HMR WETFIELD 23G BP (MISCELLANEOUS) IMPLANT
FILTER BLUE MILLIPORE (MISCELLANEOUS) ×8 IMPLANT
FILTER STRAW FLUID ASPIR (MISCELLANEOUS) IMPLANT
FORCEPS GRIESHABER ILM 25G A (INSTRUMENTS) IMPLANT
GAS AUTO FILL CONSTEL (OPHTHALMIC)
GAS AUTO FILL CONSTELLATION (OPHTHALMIC) IMPLANT
GLOVE SS BIOGEL STRL SZ 6.5 (GLOVE) ×3 IMPLANT
GLOVE SS BIOGEL STRL SZ 7 (GLOVE) ×3 IMPLANT
GLOVE SUPERSENSE BIOGEL SZ 6.5 (GLOVE) ×1
GLOVE SUPERSENSE BIOGEL SZ 7 (GLOVE) ×1
GLOVE SURG 8.5 LATEX PF (GLOVE) ×4 IMPLANT
GLOVE SURG SS PI 6.5 STRL IVOR (GLOVE) ×4 IMPLANT
GOWN STRL REUS W/ TWL LRG LVL3 (GOWN DISPOSABLE) ×9 IMPLANT
GOWN STRL REUS W/TWL LRG LVL3 (GOWN DISPOSABLE) ×3
HANDLE PNEUMATIC FOR CONSTEL (OPHTHALMIC) IMPLANT
IMPL SILICONE (Ophthalmic Related) ×3 IMPLANT
IMPLANT SILICONE (Ophthalmic Related) ×5 IMPLANT
KIT BASIN OR (CUSTOM PROCEDURE TRAY) ×4 IMPLANT
KIT PERFLUORON PROCEDURE 5ML (MISCELLANEOUS) IMPLANT
KIT ROOM TURNOVER OR (KITS) ×4 IMPLANT
KNIFE CRESCENT 1.75 EDGEAHEAD (BLADE) IMPLANT
KNIFE GRIESHABER SHARP 2.5MM (MISCELLANEOUS) ×12 IMPLANT
MICROPICK 25G (MISCELLANEOUS)
NEEDLE 18GX1X1/2 (RX/OR ONLY) (NEEDLE) ×4 IMPLANT
NEEDLE 25GX 5/8IN NON SAFETY (NEEDLE) IMPLANT
NEEDLE HYPO 30X.5 LL (NEEDLE) IMPLANT
NEEDLE PRECISIONGLIDE 27X1.5 (NEEDLE) IMPLANT
NS IRRIG 1000ML POUR BTL (IV SOLUTION) ×4 IMPLANT
PACK VITRECTOMY CUSTOM (CUSTOM PROCEDURE TRAY) ×4 IMPLANT
PAD ARMBOARD 7.5X6 YLW CONV (MISCELLANEOUS) ×8 IMPLANT
PAK PIK VITRECTOMY CVS 25GA (OPHTHALMIC) IMPLANT
PIC ILLUMINATED 25G (OPHTHALMIC)
PICK MICROPICK 25G (MISCELLANEOUS) IMPLANT
PIK ILLUMINATED 25G (OPHTHALMIC) IMPLANT
PROBE LASER ILLUM FLEX CVD 25G (OPHTHALMIC) IMPLANT
REPL STRA BRUSH NEEDLE (NEEDLE) IMPLANT
RESERVOIR BACK FLUSH (MISCELLANEOUS) IMPLANT
ROLLS DENTAL (MISCELLANEOUS) ×8 IMPLANT
SLEEVE SCLERAL TYPE 270 (Ophthalmic Related) ×4 IMPLANT
SPEAR EYE SURG WECK-CEL (MISCELLANEOUS) ×24 IMPLANT
SPONGE GROOVED SILICONE 4X12X8 (Ophthalmic Related) ×4 IMPLANT
SPONGE SURGIFOAM ABS GEL 12-7 (HEMOSTASIS) IMPLANT
STOPCOCK 4 WAY LG BORE MALE ST (IV SETS) IMPLANT
SUT CHROMIC 7 0 TG140 8 (SUTURE) ×4 IMPLANT
SUT ETHILON 9 0 TG140 8 (SUTURE) IMPLANT
SUT MERSILENE 4 0 RV 2 (SUTURE) ×8 IMPLANT
SUT SILK 2 0 (SUTURE) ×1
SUT SILK 2-0 18XBRD TIE 12 (SUTURE) ×3 IMPLANT
SUT SILK 4 0 RB 1 (SUTURE) ×4 IMPLANT
SYR 20CC LL (SYRINGE) IMPLANT
SYR 50ML LL SCALE MARK (SYRINGE) IMPLANT
SYR 5ML LL (SYRINGE) IMPLANT
SYR BULB 3OZ (MISCELLANEOUS) ×4 IMPLANT
SYR TB 1ML LUER SLIP (SYRINGE) IMPLANT
SYRINGE 10CC LL (SYRINGE) ×8 IMPLANT
SYRINGE 12CC LL (MISCELLANEOUS) ×4 IMPLANT
TAPE SURG TRANSPORE 1 IN (GAUZE/BANDAGES/DRESSINGS) ×3 IMPLANT
TAPE SURGICAL TRANSPORE 1 IN (GAUZE/BANDAGES/DRESSINGS) ×1
TIRE RTNL 2.5XGRV CNCV 9X (Ophthalmic Related) ×3 IMPLANT
TOWEL OR 17X24 6PK STRL BLUE (TOWEL DISPOSABLE) IMPLANT
TUBING HIGH PRESS EXTEN 6IN (TUBING) IMPLANT
WATER STERILE IRR 1000ML POUR (IV SOLUTION) ×4 IMPLANT
WIPE INSTRUMENT VISIWIPE 73X73 (MISCELLANEOUS) IMPLANT

## 2016-09-07 NOTE — H&P (Signed)
I examined the patient today and there is no change in the medical status 

## 2016-09-07 NOTE — Progress Notes (Signed)
Patient arrived to the unit via stretcher. Patient alert and oriented x4. VSS. Old drainage noted on left eye patch. Patient complaining of nausea. RN will release orders and give patient a dose of Zofran. Will continue to monitor.

## 2016-09-07 NOTE — Anesthesia Preprocedure Evaluation (Signed)
Anesthesia Evaluation  Patient identified by MRN, date of birth, ID band Patient awake    Reviewed: Allergy & Precautions, NPO status , Patient's Chart, lab work & pertinent test results  History of Anesthesia Complications (+) PONV and history of anesthetic complications  Airway Mallampati: II  TM Distance: >3 FB Neck ROM: Full    Dental  (+) Teeth Intact   Pulmonary    breath sounds clear to auscultation       Cardiovascular negative cardio ROS   Rhythm:Regular Rate:Normal     Neuro/Psych    GI/Hepatic Neg liver ROS, GERD  ,  Endo/Other  negative endocrine ROS  Renal/GU negative Renal ROS     Musculoskeletal negative musculoskeletal ROS (+)   Abdominal   Peds  Hematology negative hematology ROS (+)   Anesthesia Other Findings   Reproductive/Obstetrics                             Anesthesia Physical Anesthesia Plan  ASA: I  Anesthesia Plan: General   Post-op Pain Management:    Induction: Intravenous  Airway Management Planned: Oral ETT  Additional Equipment:   Intra-op Plan:   Post-operative Plan: Extubation in OR  Informed Consent: I have reviewed the patients History and Physical, chart, labs and discussed the procedure including the risks, benefits and alternatives for the proposed anesthesia with the patient or authorized representative who has indicated his/her understanding and acceptance.   Dental advisory given  Plan Discussed with:   Anesthesia Plan Comments:         Anesthesia Quick Evaluation

## 2016-09-07 NOTE — Anesthesia Procedure Notes (Signed)
Procedure Name: Intubation Date/Time: 09/07/2016 3:02 PM Performed by: Myna Bright Pre-anesthesia Checklist: Patient identified, Emergency Drugs available, Suction available and Patient being monitored Patient Re-evaluated:Patient Re-evaluated prior to inductionOxygen Delivery Method: Circle system utilized Preoxygenation: Pre-oxygenation with 100% oxygen Intubation Type: IV induction Ventilation: Mask ventilation without difficulty Laryngoscope Size: Mac and 4 Grade View: Grade I Tube type: Oral Tube size: 7.5 mm Number of attempts: 1 Airway Equipment and Method: Stylet Placement Confirmation: ETT inserted through vocal cords under direct vision,  positive ETCO2 and breath sounds checked- equal and bilateral Secured at: 23 cm Tube secured with: Tape Dental Injury: Teeth and Oropharynx as per pre-operative assessment

## 2016-09-07 NOTE — Telephone Encounter (Signed)
Wife reports patient has a detached retina and has rescheduled procedure in March

## 2016-09-07 NOTE — Brief Op Note (Signed)
Brief Operative note   Preoperative diagnosis:  retinal detachment left eye retinal break right eye Postoperative diagnosis  Retinal breaks right eye,  Retinal detachment left eye  Procedures: Scleral buckle left eye.  Laser for retinal breaks both eyes, gas injection left eye  Surgeon:  Hayden Pedro, MD...  Assistant:  Deatra Ina SA    Anesthesia: General  Specimen: none  Estimated blood loss:  1cc  Complications: none  Patient sent to PACU in good condition  Composed by Hayden Pedro MD  Dictation number: [phone]

## 2016-09-07 NOTE — Transfer of Care (Signed)
Immediate Anesthesia Transfer of Care Note  Patient: Willie Hernandez  Procedure(s) Performed: Procedure(s): LASER PHOTO ABLATION RIGHT EYE (Bilateral) SCLERAL BUCKLE WITH CRYO (Left) INSERTION OF GAS-C3F8 (Left)  Patient Location: PACU  Anesthesia Type:General  Level of Consciousness: awake and confused  Airway & Oxygen Therapy: Patient Spontanous Breathing and Patient connected to nasal cannula oxygen  Post-op Assessment: Report given to RN  Post vital signs: Reviewed and stable  Last Vitals:  Vitals:   09/07/16 1131  BP: (!) 148/83  Pulse: (!) 50  Resp: 18  Temp: 36.3 C    Last Pain:  Vitals:   09/07/16 1131  TempSrc: Oral      Patients Stated Pain Goal: 3 ([id number])  Complications: No apparent anesthesia complications

## 2016-09-08 ENCOUNTER — Encounter (HOSPITAL_COMMUNITY): Payer: Self-pay | Admitting: Ophthalmology

## 2016-09-08 DIAGNOSIS — H33021 Retinal detachment with multiple breaks, right eye: Secondary | ICD-10-CM | POA: Diagnosis not present

## 2016-09-08 MED ORDER — PREDNISOLONE ACETATE 1 % OP SUSP: 1.0000 [drp] | Freq: Four times a day (QID) | OPHTHALMIC | 0 refills | Status: DC

## 2016-09-08 MED ORDER — LATANOPROST 0.005 % OP SOLN: 1.0000 [drp] | Freq: Every day | OPHTHALMIC | 12 refills | Status: DC

## 2016-09-08 MED ORDER — HYDROCODONE-ACETAMINOPHEN 5-325 MG PO TABS: 1.0000 | ORAL_TABLET | ORAL | 0 refills | Status: DC | PRN

## 2016-09-08 MED ORDER — BACITRACIN-POLYMYXIN B 500-10000 UNIT/GM OP OINT: 1.0000 "application " | TOPICAL_OINTMENT | Freq: Three times a day (TID) | OPHTHALMIC | 0 refills | Status: DC

## 2016-09-08 MED ORDER — BRIMONIDINE TARTRATE 0.2 % OP SOLN: 1.0000 [drp] | Freq: Two times a day (BID) | OPHTHALMIC | 12 refills | Status: DC

## 2016-09-08 MED ORDER — GATIFLOXACIN 0.5 % OP SOLN: 1.0000 [drp] | Freq: Four times a day (QID) | OPHTHALMIC | Status: DC

## 2016-09-08 NOTE — Anesthesia Postprocedure Evaluation (Addendum)
Anesthesia Post Note  Patient: Willie Hernandez  Procedure(s) Performed: Procedure(s) (LRB): LASER PHOTO ABLATION RIGHT EYE (Bilateral) SCLERAL BUCKLE WITH CRYO (Left) INSERTION OF GAS-C3F8 (Left)  Patient location during evaluation: PACU Anesthesia Type: General Level of consciousness: awake and alert Pain management: pain level controlled Vital Signs Assessment: post-procedure vital signs reviewed and stable Respiratory status: spontaneous breathing, nonlabored ventilation, respiratory function stable and patient connected to nasal cannula oxygen Cardiovascular status: blood pressure returned to baseline and stable Postop Assessment: no signs of nausea or vomiting Anesthetic complications: no       Last Vitals:  Vitals:   09/07/16 2018 09/08/16 0415  BP: (!) 150/60 138/63  Pulse: (!) 50 (!) 55  Resp: 19 19  Temp: 36.9 C 36.9 C    Last Pain:  Vitals:   09/08/16 0545  TempSrc:   PainSc: 5                  Erielle Gawronski,JAMES TERRILL

## 2016-09-08 NOTE — Progress Notes (Signed)
09/08/2016, 6:42 AM  Mental Status:  Awake, Alert, Oriented  Anterior segment: Cornea  Clear    Anterior Chamber Clear    Lens:   IOL,   Intra Ocular Pressure 25 mmHg with Tonopen  Vitreous: Clear 30%gas bubble   Retina:  Attached Good laser reaction  Impression: Excellent result Retina attached   Final Diagnosis: Principal Problem:   Rhegmatogenous retinal detachment of left eye Active Problems:   Multiple retinal breaks of right eye   Plan: start post operative eye drops.  Add alphagan and Xalatan.  Discharge to home.  Give post operative instructions  Hayden Pedro 09/08/2016, 6:42 AM

## 2016-09-08 NOTE — Op Note (Signed)
NAME:  SLYVESTER, GAJ NO.:  [id number]  MEDICAL RECORD NO.:  [id number]  LOCATION:  [facility]                         FACILITY:  [facility]  PHYSICIAN:  Chrystie Nose. Zigmund Daniel, M.D. DATE OF BIRTH:  [birth year]  DATE OF PROCEDURE:  09/07/2016 DATE OF DISCHARGE:                              OPERATIVE REPORT   ADMISSION DIAGNOSES:  Retinal breaks, right eye.  Rhegmatogenous retinal detachment, left eye.  PROCEDURES:  Laser for retinal breaks, right eye.  Scleral buckle, left eye with retinal photocoagulation and gas-fluid exchange.  SURGEON:  Chrystie Nose. Zigmund Daniel, M.D.  ASSISTANT:  Deatra Ina SA.  ANESTHESIA:  General.  DETAILS:  After proper endotracheal anesthesia, the attention was carried to the right eye.  505 burns were placed around retinal breaks in the right eye.  The power was between 400 and 700 mW, 1000 microns and 0.1 seconds.  This was delivered by indirect ophthalmoscopy.  The eye was treated with ointment and taped closed and covered.  The attention was carried to the left eye where the usual prep and drape was performed.  A 360-degree limbal peritomy, isolation of 4 rectus muscles on 2-0 silk, scleral dissection for 360 degrees to admit a #279 intrascleral implant with 3 mm trim from the posterior edge.  There was a 508G radial segment placed at 2 o'clock beneath the retinal break. Diathermy was placed in the bed.  Two sutures per quadrant for total of 8 scleral sutures were placed in the scleral flaps.  The scleral buckle was placed against the globe with the joint at 8 o'clock.  240 band was placed around the globe with a 270 sleeve at 8 o'clock.  Perforation site was chosen at 3 o'clock in the middle of the bed.  After cutdown on the sclera and diathermy, the choroid was incised and the subretinal space was entered.  A large amount of clear colorless subretinal fluid came forth in a controlled manner.  The 508G radial segment was placed and these scleral  sutures were closed over the scleral buckle.  A proper indentation of the globe occurred.  The buckle was trimmed and adjusted. The band was adjusted and trimmed.  Indirect ophthalmoscopy showed the retina to be lying nicely on the scleral buckle with the retinal breaks well supported.  C3F8 50% 0.8 mL was injected through the pars plana at 12 o'clock to reinflate the globe.  The pressure at that time was 10 mmHg with a Barraquer tonometer.  The buckle was adjusted again and trimmed.  The band was adjusted and trimmed.  The indirect ophthalmoscope laser was moved into place.  941 burns were placed on the scleral buckle and other breaks in the left eye.  The power was between 400 and 700 mW, 1000 microns each and 0.1 seconds each.  The sutures were knotted and the free ends removed.  The conjunctiva was reposited with 7-0 chromic suture.  Polymyxin and ceftazidime were injected around the globe for antibiotic coverage.  Decadron 10 mg was injected into the lower subconjunctival space.  Atropine solution was applied.  Marcaine was injected around the globe for postop pain.  Polysporin ophthalmic ointment, a patch and shield were placed.  Closing  pressure was 10 with a Barraquer tonometer.  The patient was awakened and taken to recovery in satisfactory condition.  COMPLICATIONS:  None.  DURATION:  2 hours and 30 minutes.     Chrystie Nose. Zigmund Daniel, M.D.     JDM/MEDQ  D:  09/07/2016  T:  09/08/2016  Job:  [id number]

## 2016-09-08 NOTE — Progress Notes (Signed)
Discharge instructions given to pt and wife. Verbalized understanding. Left eyelids swelling noted, pt denies pain.  Discharged to home accompanied by wife.

## 2016-09-13 ENCOUNTER — Encounter: Payer: Self-pay | Admitting: Internal Medicine

## 2016-09-14 ENCOUNTER — Encounter (INDEPENDENT_AMBULATORY_CARE_PROVIDER_SITE_OTHER): Payer: 59 | Admitting: Ophthalmology

## 2016-09-14 DIAGNOSIS — H338 Other retinal detachments: Secondary | ICD-10-CM

## 2016-10-04 ENCOUNTER — Encounter (INDEPENDENT_AMBULATORY_CARE_PROVIDER_SITE_OTHER): Payer: 59 | Admitting: Ophthalmology

## 2016-10-04 DIAGNOSIS — H338 Other retinal detachments: Secondary | ICD-10-CM

## 2016-10-06 DIAGNOSIS — H33052 Total retinal detachment, left eye: Secondary | ICD-10-CM | POA: Diagnosis not present

## 2016-10-06 DIAGNOSIS — H33032 Retinal detachment with giant retinal tear, left eye: Secondary | ICD-10-CM | POA: Diagnosis not present

## 2016-10-06 DIAGNOSIS — H33021 Retinal detachment with multiple breaks, right eye: Secondary | ICD-10-CM | POA: Diagnosis not present

## 2016-10-06 DIAGNOSIS — H35371 Puckering of macula, right eye: Secondary | ICD-10-CM | POA: Diagnosis not present

## 2016-10-12 DIAGNOSIS — H35342 Macular cyst, hole, or pseudohole, left eye: Secondary | ICD-10-CM | POA: Diagnosis not present

## 2016-10-12 DIAGNOSIS — H3342 Traction detachment of retina, left eye: Secondary | ICD-10-CM | POA: Diagnosis not present

## 2016-10-12 DIAGNOSIS — H33052 Total retinal detachment, left eye: Secondary | ICD-10-CM | POA: Diagnosis not present

## 2016-10-12 DIAGNOSIS — H3322 Serous retinal detachment, left eye: Secondary | ICD-10-CM | POA: Diagnosis not present

## 2016-10-12 DIAGNOSIS — H3522 Other non-diabetic proliferative retinopathy, left eye: Secondary | ICD-10-CM | POA: Diagnosis not present

## 2016-10-15 ENCOUNTER — Encounter (INDEPENDENT_AMBULATORY_CARE_PROVIDER_SITE_OTHER): Payer: 59 | Admitting: Ophthalmology

## 2016-10-15 ENCOUNTER — Encounter: Payer: Self-pay | Admitting: Internal Medicine

## 2016-10-15 DIAGNOSIS — H338 Other retinal detachments: Secondary | ICD-10-CM

## 2016-11-17 DIAGNOSIS — H33052 Total retinal detachment, left eye: Secondary | ICD-10-CM | POA: Diagnosis not present

## 2016-11-17 DIAGNOSIS — H35342 Macular cyst, hole, or pseudohole, left eye: Secondary | ICD-10-CM | POA: Diagnosis not present

## 2016-11-17 DIAGNOSIS — H3522 Other non-diabetic proliferative retinopathy, left eye: Secondary | ICD-10-CM | POA: Diagnosis not present

## 2016-12-24 NOTE — Addendum Note (Signed)
Addendum  created 12/24/16 1123 by Halana Deisher, MD   Sign clinical note    

## 2016-12-29 DIAGNOSIS — H3522 Other non-diabetic proliferative retinopathy, left eye: Secondary | ICD-10-CM | POA: Diagnosis not present

## 2017-03-02 DIAGNOSIS — H35371 Puckering of macula, right eye: Secondary | ICD-10-CM | POA: Diagnosis not present

## 2017-03-02 DIAGNOSIS — H59812 Chorioretinal scars after surgery for detachment, left eye: Secondary | ICD-10-CM | POA: Diagnosis not present

## 2017-03-02 DIAGNOSIS — Z8669 Personal history of other diseases of the nervous system and sense organs: Secondary | ICD-10-CM | POA: Diagnosis not present

## 2017-03-02 DIAGNOSIS — H35372 Puckering of macula, left eye: Secondary | ICD-10-CM | POA: Diagnosis not present

## 2017-04-14 DIAGNOSIS — Z0000 Encounter for general adult medical examination without abnormal findings: Secondary | ICD-10-CM | POA: Diagnosis not present

## 2017-04-14 DIAGNOSIS — Z23 Encounter for immunization: Secondary | ICD-10-CM | POA: Diagnosis not present

## 2017-05-02 ENCOUNTER — Encounter: Payer: Self-pay | Admitting: Internal Medicine

## 2017-06-24 ENCOUNTER — Ambulatory Visit (AMBULATORY_SURGERY_CENTER): Payer: Self-pay | Admitting: *Deleted

## 2017-06-24 ENCOUNTER — Other Ambulatory Visit: Payer: Self-pay

## 2017-06-24 VITALS — Ht 71.0 in | Wt 155.4 lb

## 2017-06-24 DIAGNOSIS — Z1211 Encounter for screening for malignant neoplasm of colon: Secondary | ICD-10-CM

## 2017-06-24 NOTE — Progress Notes (Signed)
No egg or soy allergy known to patient  No issues with past sedation with any surgeries  or procedures, no intubation problems  No diet pills per patient No home 02 use per patient  No blood thinners per patient  Pt denies issues with constipation  No A fib or A flutter  EMMI video sent to pt's e mail  

## 2017-07-04 ENCOUNTER — Encounter: Payer: Self-pay | Admitting: Internal Medicine

## 2017-07-08 ENCOUNTER — Encounter: Payer: Self-pay | Admitting: Internal Medicine

## 2017-07-08 ENCOUNTER — Ambulatory Visit (AMBULATORY_SURGERY_CENTER): Payer: 59 | Admitting: Internal Medicine

## 2017-07-08 ENCOUNTER — Other Ambulatory Visit: Payer: Self-pay

## 2017-07-08 VITALS — BP 120/98 | HR 47 | Temp 97.8°F | Resp 14 | Ht 71.5 in | Wt 155.0 lb

## 2017-07-08 DIAGNOSIS — D123 Benign neoplasm of transverse colon: Secondary | ICD-10-CM

## 2017-07-08 DIAGNOSIS — Z1211 Encounter for screening for malignant neoplasm of colon: Secondary | ICD-10-CM

## 2017-07-08 DIAGNOSIS — D125 Benign neoplasm of sigmoid colon: Secondary | ICD-10-CM

## 2017-07-08 DIAGNOSIS — D129 Benign neoplasm of anus and anal canal: Secondary | ICD-10-CM

## 2017-07-08 DIAGNOSIS — K621 Rectal polyp: Secondary | ICD-10-CM | POA: Diagnosis not present

## 2017-07-08 DIAGNOSIS — D128 Benign neoplasm of rectum: Secondary | ICD-10-CM

## 2017-07-08 MED ORDER — SODIUM CHLORIDE 0.9 % IV SOLN: 500.0000 mL | Freq: Once | INTRAVENOUS | Status: DC

## 2017-07-08 NOTE — Patient Instructions (Addendum)
I found and removed 4 tiny polyps - all look benign.  I will let you know pathology results and when to have another routine colonoscopy by mail and/or My Chart.  I appreciate the opportunity to care for you. Gatha Mayer, MD, [facility]   Information on polyps given to you today   YOU HAD AN ENDOSCOPIC PROCEDURE TODAY AT [place]:   Refer to the procedure report that was given to you for any specific questions about what was found during the examination.  If the procedure report does not answer your questions, please call your gastroenterologist to clarify.  If you requested that your care partner not be given the details of your procedure findings, then the procedure report has been included in a sealed envelope for you to review at your convenience later.  YOU SHOULD EXPECT: Some feelings of bloating in the abdomen. Passage of more gas than usual.  Walking can help get rid of the air that was put into your GI tract during the procedure and reduce the bloating. If you had a lower endoscopy (such as a colonoscopy or flexible sigmoidoscopy) you may notice spotting of blood in your stool or on the toilet paper. If you underwent a bowel prep for your procedure, you may not have a normal bowel movement for a few days.  Please Note:  You might notice some irritation and congestion in your nose or some drainage.  This is from the oxygen used during your procedure.  There is no need for concern and it should clear up in a day or so.  SYMPTOMS TO REPORT IMMEDIATELY:   Following lower endoscopy (colonoscopy or flexible sigmoidoscopy):  Excessive amounts of blood in the stool  Significant tenderness or worsening of abdominal pains  Swelling of the abdomen that is new, acute  Fever of 100F or higher    For urgent or emergent issues, a gastroenterologist can be reached at any hour by calling [phone].   DIET:  We do recommend a small meal at first, but then you may  proceed to your regular diet.  Drink plenty of fluids but you should avoid alcoholic beverages for 24 hours.  ACTIVITY:  You should plan to take it easy for the rest of today and you should NOT DRIVE or use heavy machinery until tomorrow (because of the sedation medicines used during the test).    FOLLOW UP: Our staff will call the number listed on your records the next business day following your procedure to check on you and address any questions or concerns that you may have regarding the information given to you following your procedure. If we do not reach you, we will leave a message.  However, if you are feeling well and you are not experiencing any problems, there is no need to return our call.  We will assume that you have returned to your regular daily activities without incident.  If any biopsies were taken you will be contacted by phone or by letter within the next 1-3 weeks.  Please call us at [phone] if you have not heard about the biopsies in 3 weeks.    SIGNATURES/CONFIDENTIALITY: You and/or your care partner have signed paperwork which will be entered into your electronic medical record.  These signatures attest to the fact that that the information above on your After Visit Summary has been reviewed and is understood.  Full responsibility of the confidentiality of this discharge information lies with you and/or  your care-partner. 

## 2017-07-08 NOTE — Progress Notes (Signed)
Called to room to assist during endoscopic procedure.  Patient ID and intended procedure confirmed with present staff. Received instructions for my participation in the procedure from the performing physician.  

## 2017-07-08 NOTE — Progress Notes (Signed)
To [facility], VSS. Report to [email or web address] 

## 2017-07-08 NOTE — Progress Notes (Signed)
Pt's states no medical or surgical changes since previsit or office visit. 

## 2017-07-08 NOTE — Op Note (Signed)
[facility] Patient Name: Willie Hernandez Procedure Date: 07/08/2017 8:27 AM MRN: [id number] Endoscopist: Gatha Mayer , MD Age: 61 Referring MD:  Date of Birth: Jun 21, 1956 Gender: Male Account #: [id number] Procedure:                Colonoscopy Indications:              Screening for colorectal malignant neoplasm, Last                            colonoscopy: 2006 Medicines:                Propofol per Anesthesia, Monitored Anesthesia Care Procedure:                Pre-Anesthesia Assessment:                           - Prior to the procedure, a History and Physical                            was performed, and patient medications and                            allergies were reviewed. The patient's tolerance of                            previous anesthesia was also reviewed. The risks                            and benefits of the procedure and the sedation                            options and risks were discussed with the patient.                            All questions were answered, and informed consent                            was obtained. Prior Anticoagulants: The patient has                            taken no previous anticoagulant or antiplatelet                            agents. ASA Grade Assessment: II - A patient with                            mild systemic disease. After reviewing the risks                            and benefits, the patient was deemed in                            satisfactory condition to undergo the procedure.  After obtaining informed consent, the colonoscope                            was passed under direct vision. Throughout the                            procedure, the patient's blood pressure, pulse, and                            oxygen saturations were monitored continuously. The                            Colonoscope was introduced through the anus and                            advanced to the the  cecum, identified by                            appendiceal orifice and ileocecal valve. The                            colonoscopy was performed without difficulty. The                            patient tolerated the procedure well. The quality                            of the bowel preparation was good. The bowel                            preparation used was Miralax. The ileocecal valve,                            appendiceal orifice, and rectum were photographed. Scope In: 8:39:16 AM Scope Out: 9:00:17 AM Scope Withdrawal Time: 0 hours 16 minutes 42 seconds  Total Procedure Duration: 0 hours 21 minutes 1 second  Findings:                 The perianal and digital rectal examinations were                            normal. Pertinent negatives include normal prostate                            (size, shape, and consistency).                           Three sessile polyps were found in the rectum and                            sigmoid colon. The polyps were diminutive in size.                            These polyps were removed with a cold snare.  Resection and retrieval were complete. Verification                            of patient identification for the specimen was                            done. Estimated blood loss was minimal.                           A diminutive polyp was found in the transverse                            colon. The polyp was sessile. The polyp was removed                            with a cold biopsy forceps. Resection and retrieval                            were complete. Verification of patient                            identification for the specimen was done. Estimated                            blood loss was minimal.                           The exam was otherwise without abnormality on                            direct and retroflexion views. Complications:            No immediate complications. Estimated Blood Loss:      Estimated blood loss was minimal. Impression:               - Three diminutive polyps in the rectum and in the                            sigmoid colon, removed with a cold snare. Resected                            and retrieved.                           - One diminutive polyp in the transverse colon,                            removed with a cold biopsy forceps. Resected and                            retrieved.                           - The examination was otherwise normal on direct  and retroflexion views. Recommendation:           - Patient has a contact number available for                            emergencies. The signs and symptoms of potential                            delayed complications were discussed with the                            patient. Return to normal activities tomorrow.                            Written discharge instructions were provided to the                            patient.                           - Resume previous diet.                           - Continue present medications.                           - Repeat colonoscopy is recommended. The                            colonoscopy date will be determined after pathology                            results from today's exam become available for                            review. Gatha Mayer, MD 07/08/2017 9:07:23 AM This report has been signed electronically.

## 2017-07-11 ENCOUNTER — Telehealth: Payer: Self-pay | Admitting: *Deleted

## 2017-07-11 NOTE — Telephone Encounter (Signed)
Message left

## 2017-07-11 NOTE — Telephone Encounter (Signed)
  Follow up Call-  Call back number 07/08/2017  Post procedure Call Back phone  # [phone]  Permission to leave phone message Yes  Some recent data might be hidden     Patient questions:  Do you have a fever, pain , or abdominal swelling? Yes.   Pain Score  0 *  Have you tolerated food without any problems? Yes.    Have you been able to return to your normal activities? Yes.    Do you have any questions about your discharge instructions: Diet   No. Medications  No. Follow up visit  No.  Do you have questions or concerns about your Care? No.  Actions: * If pain score is 4 or above: No action needed, pain <4.

## 2017-07-14 ENCOUNTER — Encounter: Payer: Self-pay | Admitting: Internal Medicine

## 2017-07-14 DIAGNOSIS — Z86010 Personal history of colonic polyps: Secondary | ICD-10-CM

## 2017-07-14 DIAGNOSIS — Z860101 Personal history of adenomatous and serrated colon polyps: Secondary | ICD-10-CM

## 2017-07-14 HISTORY — DX: Personal history of adenomatous and serrated colon polyps: Z86.0101

## 2017-07-14 HISTORY — DX: Personal history of colonic polyps: Z86.010

## 2017-07-14 NOTE — Progress Notes (Signed)
2 adenomas 2 hyperplastic All diminutive Recall 2023

## 2017-09-07 DIAGNOSIS — H35372 Puckering of macula, left eye: Secondary | ICD-10-CM | POA: Diagnosis not present

## 2017-09-07 DIAGNOSIS — H35371 Puckering of macula, right eye: Secondary | ICD-10-CM | POA: Diagnosis not present

## 2017-09-07 DIAGNOSIS — H59812 Chorioretinal scars after surgery for detachment, left eye: Secondary | ICD-10-CM | POA: Diagnosis not present

## 2017-09-07 DIAGNOSIS — H31091 Other chorioretinal scars, right eye: Secondary | ICD-10-CM | POA: Diagnosis not present

## 2017-09-15 DIAGNOSIS — H25811 Combined forms of age-related cataract, right eye: Secondary | ICD-10-CM | POA: Diagnosis not present

## 2017-09-15 DIAGNOSIS — H25812 Combined forms of age-related cataract, left eye: Secondary | ICD-10-CM | POA: Insufficient documentation

## 2017-09-15 DIAGNOSIS — Z8669 Personal history of other diseases of the nervous system and sense organs: Secondary | ICD-10-CM | POA: Diagnosis not present

## 2018-03-07 DIAGNOSIS — H26222 Cataract secondary to ocular disorders (degenerative) (inflammatory), left eye: Secondary | ICD-10-CM | POA: Diagnosis not present

## 2018-03-07 DIAGNOSIS — Z8669 Personal history of other diseases of the nervous system and sense organs: Secondary | ICD-10-CM | POA: Diagnosis not present

## 2018-04-05 DIAGNOSIS — H35372 Puckering of macula, left eye: Secondary | ICD-10-CM | POA: Diagnosis not present

## 2018-04-05 DIAGNOSIS — H35371 Puckering of macula, right eye: Secondary | ICD-10-CM | POA: Diagnosis not present

## 2018-04-05 DIAGNOSIS — Z8669 Personal history of other diseases of the nervous system and sense organs: Secondary | ICD-10-CM | POA: Diagnosis not present

## 2018-04-05 DIAGNOSIS — H31093 Other chorioretinal scars, bilateral: Secondary | ICD-10-CM | POA: Diagnosis not present

## 2018-04-17 ENCOUNTER — Other Ambulatory Visit: Payer: Self-pay | Admitting: Internal Medicine

## 2018-04-17 ENCOUNTER — Ambulatory Visit
Admission: RE | Admit: 2018-04-17 | Discharge: 2018-04-17 | Disposition: A | Discharge status: Home / Self Care | Payer: 59 | Source: Ambulatory Visit | Attending: Internal Medicine | Admitting: Internal Medicine

## 2018-04-17 DIAGNOSIS — Z23 Encounter for immunization: Secondary | ICD-10-CM | POA: Diagnosis not present

## 2018-04-17 DIAGNOSIS — M25551 Pain in right hip: Secondary | ICD-10-CM | POA: Diagnosis not present

## 2018-04-17 DIAGNOSIS — Z0000 Encounter for general adult medical examination without abnormal findings: Secondary | ICD-10-CM | POA: Diagnosis not present

## 2018-04-17 DIAGNOSIS — Z125 Encounter for screening for malignant neoplasm of prostate: Secondary | ICD-10-CM | POA: Diagnosis not present

## 2018-04-17 DIAGNOSIS — M25561 Pain in right knee: Secondary | ICD-10-CM

## 2018-04-17 DIAGNOSIS — M1711 Unilateral primary osteoarthritis, right knee: Secondary | ICD-10-CM | POA: Diagnosis not present

## 2018-04-17 DIAGNOSIS — Z5181 Encounter for therapeutic drug level monitoring: Secondary | ICD-10-CM | POA: Diagnosis not present

## 2018-04-17 DIAGNOSIS — K219 Gastro-esophageal reflux disease without esophagitis: Secondary | ICD-10-CM | POA: Diagnosis not present

## 2018-04-17 DIAGNOSIS — M1611 Unilateral primary osteoarthritis, right hip: Secondary | ICD-10-CM | POA: Diagnosis not present

## 2018-04-21 DIAGNOSIS — H26222 Cataract secondary to ocular disorders (degenerative) (inflammatory), left eye: Secondary | ICD-10-CM | POA: Insufficient documentation

## 2018-04-26 ENCOUNTER — Ambulatory Visit (INDEPENDENT_AMBULATORY_CARE_PROVIDER_SITE_OTHER): Payer: 59 | Admitting: Orthopaedic Surgery

## 2018-05-08 ENCOUNTER — Encounter (INDEPENDENT_AMBULATORY_CARE_PROVIDER_SITE_OTHER): Payer: Self-pay | Admitting: Orthopaedic Surgery

## 2018-05-08 ENCOUNTER — Ambulatory Visit (INDEPENDENT_AMBULATORY_CARE_PROVIDER_SITE_OTHER): Payer: 59 | Admitting: Orthopaedic Surgery

## 2018-05-08 DIAGNOSIS — M1611 Unilateral primary osteoarthritis, right hip: Secondary | ICD-10-CM

## 2018-05-08 MED ORDER — NABUMETONE 750 MG PO TABS: 750.0000 mg | ORAL_TABLET | Freq: Two times a day (BID) | ORAL | 1 refills | Status: DC | PRN

## 2018-05-08 NOTE — Progress Notes (Signed)
``   Office Visit Note   Patient: Willie Hernandez           Date of Birth: April 10, 1956           MRN: [id number] Visit Date: 05/08/2018              Requested by: Lavone Orn, MD [address] [place], [place] [address] PCP: Lavone Orn, MD   Assessment & Plan: Visit Diagnoses:  1. Unilateral primary osteoarthritis, right hip     Plan: Given the severity of his arthritis in the right hip we are recommending a total hip arthroplasty.  He agrees with this as well.  I went over his x-rays with him in detail and his clinical exam findings.  We talked about hip replacement surgery and I gave him a handout explaining what the surgery involves.  We had a long and thorough discussion about the risk minutes of surgery as well as his intraoperative and postoperative course.  He is supposed to schedule upcoming eye surgery on his left eye and I told him once he has approval for [place] to proceed with hip replacement surgery he will give [place] a call.  I will fill out the surgery schedule sheet in the interim.  I will refill his Relafen as well.  All questions and concerns were answered and addressed.  We will await his call to schedule his hip replacement.  Follow-Up Instructions: Return for 2 weeks post-op.   Orders:  No orders of the defined types were placed in this encounter.  Meds ordered this encounter  Medications  . nabumetone (RELAFEN) 750 MG tablet    Sig: Take 1 tablet (750 mg total) by mouth 2 (two) times daily as needed.    Dispense:  60 tablet    Refill:  1      Procedures: No procedures performed   Clinical Data: No additional findings.   Subjective: Chief Complaint  Patient presents with  . Right Hip - Pain  Patient is a very pleasant and 62 year old gentleman with worsening right hip pain for over a year now.  He has x-rays on the canopy system for [place] to review.  His pain is daily and is detrimentally affected his active daily living, his his mobility and  his quality of life.  He is a very active individual does a lot of biking.  His pain is in his groin and around his hip area and radiates down to his knee.  Denies any specific injury.  His mother had bad joints and the artery has a sister that side a knee replacement.  He has been on Relafen as an anti-inflammatory.  He is work on activity modification and trying to offload that hip as well.  Is been slowly getting worse.  HPI  Review of Systems He currently denies any headache, chest pain, shortness of breath, fever, chills, nausea, vomiting.  Objective: Vital Signs: There were no vitals taken for this visit.  Physical Exam He is alert and oriented x3 and in no acute distress Ortho Exam He walks with a significant limp and a Trendelenburg gait.  His left hip exam is normal his right hip shows severe stiffness with significant pain with attempts of internal or external rotation. Specialty Comments:  No specialty comments available.  Imaging: No results found. X-rays on the canopy system of his right hip shows significant flattening of the femoral head and complete loss of joint space.  There are sclerotic changes in  the femoral head and acetabulum as well as large para-articular osteophytes.  PMFS History: Patient Active Problem List   Diagnosis Date Noted  . Unilateral primary osteoarthritis, right hip 05/08/2018  . Hx of adenomatous colonic polyps 07/14/2017  . 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 Multiple retinal breaks of right eye 09/07/2016  . GERD (gastroesophageal reflux disease) 02/20/2013   Past Medical History:  Diagnosis Date  . Allergic rhinitis   . GERD (gastroesophageal reflux disease)   . Hallux rigidus    with heel cord tightness, Dr. Sharol Given  . Headache   . Hearing loss in right ear    noise exposure  . Hx of adenomatous colonic polyps 07/14/2017  . Mitral valve prolapse    mild mitral regurgitation  . PONV (postoperative nausea and  vomiting)    from Versed  . Rhegmatogenous retinal detachment of left eye   . Spermatocele    Right, Dr. Risa Grill    Family History  Problem Relation Age of Onset  . Atrial fibrillation Father   . Sudden death Father   . Congestive Heart Failure Mother   . Alzheimer's disease Mother   . Esophageal cancer Unknown        uncle  . Colon cancer Paternal Grandfather   . Colon polyps Neg Hx   . Rectal cancer Neg Hx   . Stomach cancer Neg Hx     Past Surgical History:  Procedure Laterality Date  . COLONOSCOPY  1997, 2006  . corrective jaw surgery  2001   for overbite  . GAS INSERTION Left 09/07/2016   Procedure: INSERTION OF GAS-[id number];  Surgeon: Hayden Pedro, MD;  Location: [facility];  Service: Ophthalmology;  Laterality: Left;  . LASER PHOTO ABLATION Bilateral 09/07/2016   Procedure: LASER PHOTO ABLATION RIGHT EYE;  Surgeon: Hayden Pedro, MD;  Location: [facility];  Service: Ophthalmology;  Laterality: Bilateral;  . laser retinal tears OS  04/2012  . SCLERAL BUCKLE WITH CRYO Left 09/07/2016   Procedure: SCLERAL BUCKLE WITH CRYO;  Surgeon: Hayden Pedro, MD;  Location: [facility];  Service: Ophthalmology;  Laterality: Left;  . UPPER GASTROINTESTINAL ENDOSCOPY     Social History   Occupational History  . Occupation: Forensic psychologist for [employer].    Employer: [employer]   Tobacco Use  . Smoking status: Never Smoker  . Smokeless tobacco: Never Used  Substance and Sexual Activity  . Alcohol use: Yes    Comment: occasional  . Drug use: No  . Sexual activity: Not on file

## 2018-05-22 DIAGNOSIS — Z961 Presence of intraocular lens: Secondary | ICD-10-CM | POA: Diagnosis not present

## 2018-05-22 DIAGNOSIS — H21542 Posterior synechiae (iris), left eye: Secondary | ICD-10-CM | POA: Diagnosis not present

## 2018-05-22 DIAGNOSIS — H25812 Combined forms of age-related cataract, left eye: Secondary | ICD-10-CM | POA: Diagnosis not present

## 2018-05-22 DIAGNOSIS — H2512 Age-related nuclear cataract, left eye: Secondary | ICD-10-CM | POA: Diagnosis not present

## 2018-05-22 DIAGNOSIS — H59022 Cataract (lens) fragments in eye following cataract surgery, left eye: Secondary | ICD-10-CM | POA: Diagnosis not present

## 2018-05-22 DIAGNOSIS — H59092 Other disorders of the left eye following cataract surgery: Secondary | ICD-10-CM | POA: Diagnosis not present

## 2018-05-23 DIAGNOSIS — H26222 Cataract secondary to ocular disorders (degenerative) (inflammatory), left eye: Secondary | ICD-10-CM | POA: Diagnosis not present

## 2018-05-27 DIAGNOSIS — Z23 Encounter for immunization: Secondary | ICD-10-CM | POA: Diagnosis not present

## 2018-05-27 HISTORY — PX: OTHER SURGICAL HISTORY: SHX169

## 2018-06-15 ENCOUNTER — Encounter (HOSPITAL_COMMUNITY): Payer: Self-pay | Admitting: *Deleted

## 2018-06-19 ENCOUNTER — Other Ambulatory Visit (INDEPENDENT_AMBULATORY_CARE_PROVIDER_SITE_OTHER): Payer: Self-pay | Admitting: Physician Assistant

## 2018-06-19 ENCOUNTER — Encounter (HOSPITAL_COMMUNITY): Payer: Self-pay

## 2018-06-21 ENCOUNTER — Other Ambulatory Visit: Payer: Self-pay

## 2018-06-21 ENCOUNTER — Encounter (HOSPITAL_COMMUNITY)
Admission: RE | Admit: 2018-06-21 | Discharge: 2018-06-21 | Disposition: A | Discharge status: Home / Self Care | Payer: 59 | Source: Ambulatory Visit | Attending: Orthopaedic Surgery | Admitting: Orthopaedic Surgery

## 2018-06-21 ENCOUNTER — Encounter (HOSPITAL_COMMUNITY): Payer: Self-pay

## 2018-06-21 DIAGNOSIS — Z01812 Encounter for preprocedural laboratory examination: Secondary | ICD-10-CM | POA: Diagnosis not present

## 2018-06-21 DIAGNOSIS — M1611 Unilateral primary osteoarthritis, right hip: Secondary | ICD-10-CM | POA: Diagnosis not present

## 2018-06-21 LAB — CBC
HCT: 41.6 % (ref 39.0–52.0)
HEMOGLOBIN: 13.8 g/dL (ref 13.0–17.0)
MCH: 31.4 pg (ref 26.0–34.0)
MCHC: 33.2 g/dL (ref 30.0–36.0)
MCV: 94.5 fL (ref 80.0–100.0)
PLATELETS: 217 10*3/uL (ref 150–400)
RBC: 4.4 MIL/uL (ref 4.22–5.81)
RDW: 12.9 % (ref 11.5–15.5)
WBC: 4 10*3/uL (ref 4.0–10.5)
nRBC: 0 % (ref 0.0–0.2)

## 2018-06-21 LAB — BASIC METABOLIC PANEL
Anion gap: 4 — ABNORMAL LOW (ref 5–15)
BUN: 23 mg/dL (ref 8–23)
CHLORIDE: 105 mmol/L (ref 98–111)
CO2: 28 mmol/L (ref 22–32)
Calcium: 8.9 mg/dL (ref 8.9–10.3)
Creatinine, Ser: 0.63 mg/dL (ref 0.61–1.24)
GFR calc Af Amer: >60 mL/min (ref >60)
GFR calc non Af Amer: >60 mL/min (ref >60)
Glucose, Bld: 97 mg/dL (ref 70–99)
Potassium: 4.6 mmol/L (ref 3.5–5.1)
SODIUM: 137 mmol/L (ref 135–145)

## 2018-06-21 LAB — SURGICAL PCR SCREEN
MRSA, PCR: NEGATIVE
Staphylococcus aureus: NEGATIVE

## 2018-06-21 NOTE — Patient Instructions (Addendum)
Willie Hernandez  06/21/2018   Your procedure is scheduled VC:[id number] 06/30/2018   Report to [facility] Main  Entrance              Report to admitting at  [place]  AM    Call this number if you have problems the morning of surgery [phone]    Remember: Do not eat food or drink liquids :After Midnight.               BRUSH YOUR TEETH MORNING OF SURGERY AND RINSE YOUR MOUTH OUT, NO CHEWING GUM CANDY OR MINTS.     Take these medicines the morning of surgery with A SIP OF WATER: use Pred Forte eye drops, use Flonase nasal spray, take Famotidine (Pepcid) id needed                                You may not have any metal on your body including hair pins and              piercings  Do not wear jewelry, make-up, lotions, powders or perfumes, deodorant             Men may shave face and neck.   Do not bring valuables to the hospital. [place].  Contacts, dentures or bridgework may not be worn into surgery.  Leave suitcase in the car. After surgery it may be brought to your room.                  Please read over the following fact sheets you were given: _____________________________________________________________________             [facility] - Preparing for Surgery Before surgery, you can play an important role.  Because skin is not sterile, your skin needs to be as free of germs as possible.  You can reduce the number of germs on your skin by washing with CHG (chlorahexidine gluconate) soap before surgery.  CHG is an antiseptic cleaner which kills germs and bonds with the skin to continue killing germs even after washing. Please DO NOT use if you have an allergy to CHG or antibacterial soaps.  If your skin becomes reddened/irritated stop using the CHG and inform your nurse when you arrive at Short Stay. Do not shave (including legs and underarms) for at least 48 hours prior to the first CHG shower.  You  may shave your face/neck. Please follow these instructions carefully:  1.  Shower with CHG Soap the night before surgery and the  morning of Surgery.  2.  If you choose to wash your hair, wash your hair first as usual with your  normal  shampoo.  3.  After you shampoo, rinse your hair and body thoroughly to remove the  shampoo.                           4.  Use CHG as you would any other liquid soap.  You can apply chg directly  to the skin and wash                       Gently with a scrungie or clean washcloth.  5.  Apply the CHG Soap to your body ONLY FROM THE NECK DOWN.   Do not use on face/ open                           Wound or open sores. Avoid contact with eyes, ears mouth and genitals (private parts).                       Wash face,  Genitals (private parts) with your normal soap.             6.  Wash thoroughly, paying special attention to the area where your surgery  will be performed.  7.  Thoroughly rinse your body with warm water from the neck down.  8.  DO NOT shower/wash with your normal soap after using and rinsing off  the CHG Soap.                9.  Pat yourself dry with a clean towel.            10.  Wear clean pajamas.            11.  Place clean sheets on your bed the night of your first shower and do not  sleep with pets. Day of Surgery : Do not apply any lotions/deodorants the morning of surgery.  Please wear clean clothes to the hospital/surgery center.  FAILURE TO FOLLOW THESE INSTRUCTIONS MAY RESULT IN THE CANCELLATION OF YOUR SURGERY PATIENT SIGNATURE_________________________________  NURSE SIGNATURE__________________________________  ________________________________________________________________________   Willie Hernandez  An incentive spirometer is a tool that can help keep your lungs clear and active. This tool measures how well you are filling your lungs with each breath. Taking long deep breaths may help reverse or decrease the chance of  developing breathing (pulmonary) problems (especially infection) following:  A long period of time when you are unable to move or be active. BEFORE THE PROCEDURE   If the spirometer includes an indicator to show your best effort, your nurse or respiratory therapist will set it to a desired goal.  If possible, sit up straight or lean slightly forward. Try not to slouch.  Hold the incentive spirometer in an upright position. INSTRUCTIONS FOR USE  1. Sit on the edge of your bed if possible, or sit up as far as you can in bed or on a chair. 2. Hold the incentive spirometer in an upright position. 3. Breathe out normally. 4. Place the mouthpiece in your mouth and seal your lips tightly around it. 5. Breathe in slowly and as deeply as possible, raising the piston or the ball toward the top of the column. 6. Hold your breath for 3-5 seconds or for as long as possible. Allow the piston or ball to fall to the bottom of the column. 7. Remove the mouthpiece from your mouth and breathe out normally. 8. Rest for a few seconds and repeat Steps 1 through 7 at least 10 times every 1-2 hours when you are awake. Take your time and take a few normal breaths between deep breaths. 9. The spirometer may include an indicator to show your best effort. Use the indicator as a goal to work toward during each repetition. 10. After each set of 10 deep breaths, practice coughing to be sure your lungs are clear. If you have an incision (the cut made at the time of surgery), support your incision when coughing by placing a  pillow or rolled up towels firmly against it. Once you are able to get out of bed, walk around indoors and cough well. You may stop using the incentive spirometer when instructed by your caregiver.  RISKS AND COMPLICATIONS  Take your time so you do not get dizzy or light-headed.  If you are in pain, you may need to take or ask for pain medication before doing incentive spirometry. It is harder to take a  deep breath if you are having pain. AFTER USE  Rest and breathe slowly and easily.  It can be helpful to keep track of a log of your progress. Your caregiver can provide you with a simple table to help with this. If you are using the spirometer at home, follow these instructions: [place] IF:   You are having difficultly using the spirometer.  You have trouble using the spirometer as often as instructed.  Your pain medication is not giving enough relief while using the spirometer.  You develop fever of 100.5 F (38.1 C) or higher. SEEK IMMEDIATE MEDICAL CARE IF:   You cough up bloody sputum that had not been present before.  You develop fever of 102 F (38.9 C) or greater.  You develop worsening pain at or near the incision site. MAKE SURE YOU:   Understand these instructions.  Will watch your condition.  Will get help right away if you are not doing well or get worse. Document Released: 11/22/2006 Document Revised: 10/04/2011 Document Reviewed: 01/23/2007 [facility] Patient Information 2014 [place], [place].   ________________________________________________________________________

## 2018-06-30 ENCOUNTER — Inpatient Hospital Stay (HOSPITAL_COMMUNITY): Payer: 59 | Admitting: Certified Registered Nurse Anesthetist

## 2018-06-30 ENCOUNTER — Encounter (HOSPITAL_COMMUNITY): Payer: Self-pay | Admitting: Certified Registered Nurse Anesthetist

## 2018-06-30 ENCOUNTER — Inpatient Hospital Stay (HOSPITAL_COMMUNITY)
Admission: RE | Admit: 2018-06-30 | Discharge: 2018-07-02 | DRG: 470 | Disposition: A | Discharge status: Home Health | Payer: 59 | Attending: Orthopedic Surgery | Admitting: Orthopedic Surgery

## 2018-06-30 ENCOUNTER — Inpatient Hospital Stay (HOSPITAL_COMMUNITY): Payer: 59

## 2018-06-30 ENCOUNTER — Encounter (HOSPITAL_COMMUNITY)
Admission: RE | Disposition: A | Discharge status: Home Health | Payer: Self-pay | Source: Home / Self Care | Attending: Orthopaedic Surgery

## 2018-06-30 ENCOUNTER — Other Ambulatory Visit: Payer: Self-pay

## 2018-06-30 DIAGNOSIS — Z86010 Personal history of colonic polyps: Secondary | ICD-10-CM | POA: Diagnosis not present

## 2018-06-30 DIAGNOSIS — H9191 Unspecified hearing loss, right ear: Secondary | ICD-10-CM | POA: Diagnosis present

## 2018-06-30 DIAGNOSIS — Z96641 Presence of right artificial hip joint: Secondary | ICD-10-CM | POA: Diagnosis not present

## 2018-06-30 DIAGNOSIS — Z820 Family history of epilepsy and other diseases of the nervous system: Secondary | ICD-10-CM

## 2018-06-30 DIAGNOSIS — Z800 Family history of malignant neoplasm of digestive organs: Secondary | ICD-10-CM

## 2018-06-30 DIAGNOSIS — M25751 Osteophyte, right hip: Secondary | ICD-10-CM | POA: Diagnosis not present

## 2018-06-30 DIAGNOSIS — M1611 Unilateral primary osteoarthritis, right hip: Principal | ICD-10-CM | POA: Diagnosis present

## 2018-06-30 DIAGNOSIS — K219 Gastro-esophageal reflux disease without esophagitis: Secondary | ICD-10-CM | POA: Diagnosis not present

## 2018-06-30 DIAGNOSIS — Z881 Allergy status to other antibiotic agents status: Secondary | ICD-10-CM

## 2018-06-30 DIAGNOSIS — Z882 Allergy status to sulfonamides status: Secondary | ICD-10-CM

## 2018-06-30 DIAGNOSIS — I341 Nonrheumatic mitral (valve) prolapse: Secondary | ICD-10-CM | POA: Diagnosis present

## 2018-06-30 DIAGNOSIS — Z471 Aftercare following joint replacement surgery: Secondary | ICD-10-CM | POA: Diagnosis not present

## 2018-06-30 DIAGNOSIS — Z419 Encounter for procedure for purposes other than remedying health state, unspecified: Secondary | ICD-10-CM

## 2018-06-30 DIAGNOSIS — Z8249 Family history of ischemic heart disease and other diseases of the circulatory system: Secondary | ICD-10-CM

## 2018-06-30 HISTORY — PX: TOTAL HIP ARTHROPLASTY: SHX124

## 2018-06-30 SURGERY — ARTHROPLASTY, HIP, TOTAL, ANTERIOR APPROACH
Anesthesia: Spinal | Laterality: Right

## 2018-06-30 MED ORDER — SODIUM CHLORIDE 0.9 % IR SOLN
Status: DC | PRN
  Administered 2018-06-30: 1000 mL

## 2018-06-30 MED ORDER — OXYCODONE HCL 5 MG PO TABS
5.0000 mg | ORAL_TABLET | ORAL | Status: DC | PRN
  Administered 2018-06-30: 10 mg via ORAL
  Administered 2018-06-30 – 2018-07-01 (×3): 5 mg via ORAL
  Administered 2018-07-01: 10 mg via ORAL
  Administered 2018-07-01: 5 mg via ORAL
  Filled 2018-06-30: qty 2
  Filled 2018-06-30: qty 1
  Filled 2018-06-30 (×3): qty 2
  Filled 2018-06-30 (×2): qty 1

## 2018-06-30 MED ORDER — EPHEDRINE SULFATE 50 MG/ML IJ SOLN
INTRAMUSCULAR | Status: DC | PRN
  Administered 2018-06-30: 10 mg via INTRAVENOUS
  Administered 2018-06-30: 15 mg via INTRAVENOUS

## 2018-06-30 MED ORDER — PHENOL 1.4 % MT LIQD
1.0000 | OROMUCOSAL | Status: DC | PRN
  Filled 2018-06-30: qty 177

## 2018-06-30 MED ORDER — PREDNISOLONE ACETATE 1 % OP SUSP
1.0000 [drp] | Freq: Three times a day (TID) | OPHTHALMIC | Status: DC
  Filled 2018-06-30: qty 5

## 2018-06-30 MED ORDER — DOCUSATE SODIUM 100 MG PO CAPS
100.0000 mg | ORAL_CAPSULE | Freq: Two times a day (BID) | ORAL | Status: DC
  Administered 2018-06-30 – 2018-07-02 (×4): 100 mg via ORAL
  Filled 2018-06-30 (×4): qty 1

## 2018-06-30 MED ORDER — VITAMIN C 500 MG PO TABS
500.0000 mg | ORAL_TABLET | Freq: Every day | ORAL | Status: DC
  Administered 2018-07-01 – 2018-07-02 (×2): 500 mg via ORAL
  Filled 2018-06-30 (×2): qty 1

## 2018-06-30 MED ORDER — HYDROMORPHONE HCL 1 MG/ML IJ SOLN
0.5000 mg | INTRAMUSCULAR | Status: DC | PRN
  Administered 2018-06-30 – 2018-07-01 (×3): 1 mg via INTRAVENOUS
  Filled 2018-06-30 (×3): qty 1

## 2018-06-30 MED ORDER — CEFAZOLIN SODIUM-DEXTROSE 2-4 GM/100ML-% IV SOLN
2.0000 g | INTRAVENOUS | Status: AC
  Administered 2018-06-30: 2 g via INTRAVENOUS
  Filled 2018-06-30: qty 100

## 2018-06-30 MED ORDER — METHOCARBAMOL 500 MG IVPB - SIMPLE MED
500.0000 mg | Freq: Four times a day (QID) | INTRAVENOUS | Status: DC | PRN
  Filled 2018-06-30: qty 50

## 2018-06-30 MED ORDER — FENTANYL CITRATE (PF) 100 MCG/2ML IJ SOLN
INTRAMUSCULAR | Status: AC
  Filled 2018-06-30: qty 2

## 2018-06-30 MED ORDER — METOCLOPRAMIDE HCL 5 MG PO TABS: 5.0000 mg | ORAL_TABLET | Freq: Three times a day (TID) | ORAL | Status: DC | PRN

## 2018-06-30 MED ORDER — TRANEXAMIC ACID-NACL 1000-0.7 MG/100ML-% IV SOLN
1000.0000 mg | INTRAVENOUS | Status: AC
  Administered 2018-06-30: 1000 mg via INTRAVENOUS
  Filled 2018-06-30: qty 100

## 2018-06-30 MED ORDER — METHOCARBAMOL 500 MG PO TABS
500.0000 mg | ORAL_TABLET | Freq: Four times a day (QID) | ORAL | Status: DC | PRN
  Administered 2018-07-01 – 2018-07-02 (×2): 500 mg via ORAL
  Filled 2018-06-30 (×2): qty 1

## 2018-06-30 MED ORDER — FENTANYL CITRATE (PF) 100 MCG/2ML IJ SOLN
INTRAMUSCULAR | Status: DC | PRN
  Administered 2018-06-30: 100 ug via INTRAVENOUS

## 2018-06-30 MED ORDER — DIPHENHYDRAMINE HCL 12.5 MG/5ML PO ELIX: 12.5000 mg | ORAL_SOLUTION | ORAL | Status: DC | PRN

## 2018-06-30 MED ORDER — ONDANSETRON HCL 4 MG PO TABS: 4.0000 mg | ORAL_TABLET | Freq: Four times a day (QID) | ORAL | Status: DC | PRN

## 2018-06-30 MED ORDER — LATANOPROST 0.005 % OP SOLN
1.0000 [drp] | Freq: Every day | OPHTHALMIC | Status: DC
  Administered 2018-07-01: 1 [drp] via OPHTHALMIC
  Filled 2018-06-30: qty 2.5

## 2018-06-30 MED ORDER — PROPOFOL 500 MG/50ML IV EMUL
INTRAVENOUS | Status: DC | PRN
  Administered 2018-06-30: 75 ug/kg/min via INTRAVENOUS

## 2018-06-30 MED ORDER — POLYETHYLENE GLYCOL 3350 17 G PO PACK: 17.0000 g | PACK | Freq: Every day | ORAL | Status: DC | PRN

## 2018-06-30 MED ORDER — FAMOTIDINE 20 MG PO TABS: 20.0000 mg | ORAL_TABLET | Freq: Every day | ORAL | Status: DC | PRN

## 2018-06-30 MED ORDER — PROPOFOL 10 MG/ML IV BOLUS
INTRAVENOUS | Status: AC
  Filled 2018-06-30: qty 60

## 2018-06-30 MED ORDER — BUPIVACAINE HCL (PF) 0.75 % IJ SOLN
INTRAMUSCULAR | Status: DC | PRN
  Administered 2018-06-30: 1.8 mL via INTRATHECAL

## 2018-06-30 MED ORDER — ONDANSETRON HCL 4 MG/2ML IJ SOLN
INTRAMUSCULAR | Status: DC | PRN
  Administered 2018-06-30: 4 mg via INTRAVENOUS

## 2018-06-30 MED ORDER — OXYCODONE HCL 5 MG PO TABS
10.0000 mg | ORAL_TABLET | ORAL | Status: DC | PRN
  Administered 2018-07-01: 15 mg via ORAL
  Administered 2018-07-02: 10 mg via ORAL
  Filled 2018-06-30: qty 3

## 2018-06-30 MED ORDER — MENTHOL 3 MG MT LOZG: 1.0000 | LOZENGE | OROMUCOSAL | Status: DC | PRN

## 2018-06-30 MED ORDER — ACETAMINOPHEN 325 MG PO TABS
325.0000 mg | ORAL_TABLET | Freq: Four times a day (QID) | ORAL | Status: DC | PRN
  Administered 2018-07-01: 650 mg via ORAL
  Filled 2018-06-30 (×2): qty 2

## 2018-06-30 MED ORDER — ALUM & MAG HYDROXIDE-SIMETH 200-200-20 MG/5ML PO SUSP: 30.0000 mL | ORAL | Status: DC | PRN

## 2018-06-30 MED ORDER — ZOLPIDEM TARTRATE 5 MG PO TABS: 5.0000 mg | ORAL_TABLET | Freq: Every evening | ORAL | Status: DC | PRN

## 2018-06-30 MED ORDER — DEXAMETHASONE SODIUM PHOSPHATE 10 MG/ML IJ SOLN
INTRAMUSCULAR | Status: DC | PRN
  Administered 2018-06-30: 10 mg via INTRAVENOUS

## 2018-06-30 MED ORDER — SODIUM CHLORIDE 0.9 % IV SOLN
INTRAVENOUS | Status: DC
  Administered 2018-06-30 – 2018-07-01 (×2): via INTRAVENOUS

## 2018-06-30 MED ORDER — CEFAZOLIN SODIUM-DEXTROSE 1-4 GM/50ML-% IV SOLN
1.0000 g | Freq: Four times a day (QID) | INTRAVENOUS | Status: AC
  Administered 2018-06-30 (×2): 1 g via INTRAVENOUS
  Filled 2018-06-30 (×2): qty 50

## 2018-06-30 MED ORDER — 0.9 % SODIUM CHLORIDE (POUR BTL) OPTIME
TOPICAL | Status: DC | PRN
  Administered 2018-06-30: 1000 mL

## 2018-06-30 MED ORDER — LACTATED RINGERS IV SOLN
INTRAVENOUS | Status: DC
  Administered 2018-06-30 (×2): via INTRAVENOUS

## 2018-06-30 MED ORDER — HYDROMORPHONE HCL 1 MG/ML IJ SOLN: 0.2500 mg | INTRAMUSCULAR | Status: DC | PRN

## 2018-06-30 MED ORDER — ONDANSETRON HCL 4 MG/2ML IJ SOLN
4.0000 mg | Freq: Four times a day (QID) | INTRAMUSCULAR | Status: DC | PRN
  Administered 2018-07-01: 4 mg via INTRAVENOUS
  Filled 2018-06-30: qty 2

## 2018-06-30 MED ORDER — FLUTICASONE PROPIONATE 50 MCG/ACT NA SUSP
1.0000 | Freq: Every day | NASAL | Status: DC
  Administered 2018-06-30: 1 via NASAL
  Filled 2018-06-30: qty 16

## 2018-06-30 MED ORDER — ASPIRIN 81 MG PO CHEW
81.0000 mg | CHEWABLE_TABLET | Freq: Two times a day (BID) | ORAL | Status: DC
  Administered 2018-06-30 – 2018-07-02 (×4): 81 mg via ORAL
  Filled 2018-06-30 (×4): qty 1

## 2018-06-30 MED ORDER — VITAMIN D 25 MCG (1000 UNIT) PO TABS
1000.0000 [IU] | ORAL_TABLET | Freq: Every day | ORAL | Status: DC
  Administered 2018-07-01 – 2018-07-02 (×2): 1000 [IU] via ORAL
  Filled 2018-06-30 (×2): qty 1

## 2018-06-30 MED ORDER — METOCLOPRAMIDE HCL 5 MG/ML IJ SOLN: 5.0000 mg | Freq: Three times a day (TID) | INTRAMUSCULAR | Status: DC | PRN

## 2018-06-30 MED ORDER — CHLORHEXIDINE GLUCONATE 4 % EX LIQD: 60.0000 mL | Freq: Once | CUTANEOUS | Status: DC

## 2018-06-30 MED ORDER — GATIFLOXACIN 0.5 % OP SOLN
1.0000 [drp] | Freq: Three times a day (TID) | OPHTHALMIC | Status: DC
  Filled 2018-06-30: qty 2.5

## 2018-06-30 MED ORDER — PROMETHAZINE HCL 25 MG/ML IJ SOLN: 6.2500 mg | INTRAMUSCULAR | Status: DC | PRN

## 2018-06-30 MED ORDER — STERILE WATER FOR IRRIGATION IR SOLN
Status: DC | PRN
  Administered 2018-06-30: 2000 mL

## 2018-06-30 MED ORDER — VITAMIN D 25 MCG (1000 UNIT) PO TABS: 1000.0000 [IU] | ORAL_TABLET | Freq: Every day | ORAL | Status: DC

## 2018-06-30 SURGICAL SUPPLY — 40 items
BAG ZIPLOCK 12X15 (MISCELLANEOUS) IMPLANT
BENZOIN TINCTURE PRP APPL 2/3 (GAUZE/BANDAGES/DRESSINGS) ×2 IMPLANT
BLADE SAW SGTL 18X1.27X75 (BLADE) ×2 IMPLANT
BLADE SURG SZ10 CARB STEEL (BLADE) ×4 IMPLANT
COVER PERINEAL POST (MISCELLANEOUS) ×2 IMPLANT
COVER SURGICAL LIGHT HANDLE (MISCELLANEOUS) ×2 IMPLANT
COVER WAND RF STERILE (DRAPES) IMPLANT
CUP ACET PNNCL SECTR W/GRIP 56 (Hips) ×1 IMPLANT
DRAPE STERI IOBAN 125X83 (DRAPES) ×2 IMPLANT
DRAPE U-SHAPE 47X51 STRL (DRAPES) ×4 IMPLANT
DRSG AQUACEL AG ADV 3.5X10 (GAUZE/BANDAGES/DRESSINGS) ×2 IMPLANT
DURAPREP 26ML APPLICATOR (WOUND CARE) ×2 IMPLANT
ELECT REM PT RETURN 15FT ADLT (MISCELLANEOUS) ×2 IMPLANT
GAUZE XEROFORM 1X8 LF (GAUZE/BANDAGES/DRESSINGS) IMPLANT
GAUZE XEROFORM 5X9 LF (GAUZE/BANDAGES/DRESSINGS) ×2 IMPLANT
GLOVE BIO SURGEON STRL SZ7.5 (GLOVE) ×2 IMPLANT
GLOVE BIOGEL PI IND STRL 8 (GLOVE) ×2 IMPLANT
GLOVE BIOGEL PI INDICATOR 8 (GLOVE) ×2
GLOVE ECLIPSE 8.0 STRL XLNG CF (GLOVE) ×2 IMPLANT
GOWN STRL REUS W/TWL XL LVL3 (GOWN DISPOSABLE) ×4 IMPLANT
HANDPIECE INTERPULSE COAX TIP (DISPOSABLE) ×1
HEAD CERAMIC 36 PLUS5 (Hips) ×2 IMPLANT
HOLDER FOLEY CATH W/STRAP (MISCELLANEOUS) ×2 IMPLANT
LINER NEUTRAL 52MMX36MMX56N (Liner) ×2 IMPLANT
PACK ANTERIOR HIP CUSTOM (KITS) ×2 IMPLANT
PINN SECTOR W/GRIP ACE CUP 56 (Hips) ×2 IMPLANT
SCREW 6.5MMX25MM (Screw) ×2 IMPLANT
SET HNDPC FAN SPRY TIP SCT (DISPOSABLE) ×1 IMPLANT
STAPLER VISISTAT 35W (STAPLE) IMPLANT
STEM CORAIL (Stem) ×2 IMPLANT
STRIP CLOSURE SKIN 1/2X4 (GAUZE/BANDAGES/DRESSINGS) IMPLANT
SUT ETHIBOND NAB CT1 #1 30IN (SUTURE) ×2 IMPLANT
SUT MNCRL AB 4-0 PS2 18 (SUTURE) IMPLANT
SUT VIC AB 0 CT1 36 (SUTURE) ×2 IMPLANT
SUT VIC AB 1 CT1 36 (SUTURE) ×2 IMPLANT
SUT VIC AB 2-0 CT1 27 (SUTURE) ×2
SUT VIC AB 2-0 CT1 TAPERPNT 27 (SUTURE) ×2 IMPLANT
TAPE STRIPS DRAPE STRL (GAUZE/BANDAGES/DRESSINGS) ×2 IMPLANT
TRAY FOLEY MTR SLVR 16FR STAT (SET/KITS/TRAYS/PACK) ×2 IMPLANT
YANKAUER SUCT BULB TIP 10FT TU (MISCELLANEOUS) ×2 IMPLANT

## 2018-06-30 NOTE — Anesthesia Postprocedure Evaluation (Signed)
Anesthesia Post Note  Patient: Willie Hernandez  Procedure(s) Performed: RIGHT TOTAL HIP ARTHROPLASTY ANTERIOR APPROACH (Right )     Patient location during evaluation: PACU Anesthesia Type: Spinal Level of consciousness: oriented and awake and alert Pain management: pain level controlled Vital Signs Assessment: post-procedure vital signs reviewed and stable Respiratory status: spontaneous breathing, respiratory function stable and patient connected to nasal cannula oxygen Cardiovascular status: blood pressure returned to baseline and stable Postop Assessment: no headache, no backache and no apparent nausea or vomiting Anesthetic complications: no    Last Vitals:  Vitals:   06/30/18 1115 06/30/18 1130  BP: 114/76 109/69  Pulse: (!) 49 (!) 48  Resp: 17 12  Temp: (!) 35.8 C (!) 35.7 C  SpO2: 100% 100%    Last Pain:  Vitals:   06/30/18 1130  TempSrc:   PainSc: 0-No pain                 Jaz Laningham S

## 2018-06-30 NOTE — Op Note (Signed)
NAME: Willie Hernandez, Willie Hernandez. MEDICAL RECORD [id number] ACCOUNT [id number] DATE OF BIRTH:06/17/1956 FACILITY: [facility] LOCATION: [facility] PHYSICIAN:Linkyn Gobin Kerry Fort, MD  OPERATIVE REPORT  DATE OF PROCEDURE:  06/30/2018  PREOPERATIVE DIAGNOSIS:  Primary osteoarthritis and degenerative joint disease, right hip.  POSTOPERATIVE DIAGNOSIS:  Primary osteoarthritis and degenerative joint disease, right hip.  PROCEDURE:  Right total hip arthroplasty through direct anterior approach.  IMPLANTS:  [employer] G[employer]ion acetabular component size 56 with a single screw, size 36+0 neutral poly liner, size 25 screw in the acetabular component, size 13 Corail femoral component with varus offset, size 36+5 ceramic hip ball.  SURGEON:  Lind Guest. Ninfa Linden, MD  ASSISTANT:  Erskine Emery, PA-C.  ANESTHESIA:  Spinal.  ANTIBIOTICS:  Two grams IV Ancef.  ESTIMATED BLOOD LOSS:  Less  than 200 mL.  COMPLICATIONS:  None.  INDICATIONS:  The patient is a very pleasant and active 62 year old gentleman with debilitating arthritis involving his right hip.  It has slowly gotten worse with time.  His x-rays show complete loss of the superolateral joint space with sclerotic  changes in the femoral head and acetabulum.  There were large periarticular osteophytes as well.  At this point, his mobility has become limited to the pain in his hip.  He is very stiff.  He has had worsening of his mobility and his activities of daily  living as it relates to his debilitating right hip pain.  At this point, he does wish to proceed with total hip arthroplasty.  We had a long and thorough discussion about the risk of acute blood loss anemia, nerve or vessel injury, fracture, infection,  dislocation, DVT.  He understands our goals are to decrease pain, improve mobility and overall improve quality of life.  DESCRIPTION OF PROCEDURE:  After informed consent was obtained and appropriate right hip was marked and he was  brought to the operating room and sat up on a stretcher where spinal anesthesia was obtained.  He was then laid in the supine position on a  stretcher.  Foley catheter was placed and then particularly assessed his leg lengths.  Preoperatively, he does not feel like he has a leg length discrepancy and intraoperatively with him completely sedated, I did not feel there is a leg length  discrepancy.  I then placed traction boots on both his feet.  We placed him supine on the Hana fracture table, perineal post in place and both legs in line skeletal traction device and no traction applied.  I then assessed his hip preoperatively with a  fluoroscopic x-ray, so we could match his preoperative and postoperative films intraoperatively given a leg length discrepancy.  His right operative hip was prepped and draped with DuraPrep and sterile drapes.  A time-out was called and he was identified  as the correct patient, correct right hip.  We then made an incision just inferior and posterior to the anterior iliac spine and carried this obliquely down the leg.  We dissected down tensor fascia lata muscle.  Tensor fascia was then divided  longitudinally to proceed with direct anterior approach to the hip.  We identified and cauterized circumflex vessels.  I then opened the hip capsule.  I opened the hip capsule in an L-type format, finding a moderate joint effusion and significant  periarticular osteophytes around the lateral femoral head and neck.  I placed a Cobra retractor around the medial and lateral femoral neck and we made our femoral neck cut with an oscillating saw just proximal to the lesser trochanter  and completed this  with an osteotome.  We placed a corkscrew guide in the femoral head and removed the femoral head in its entirety and found there to be a large area devoid of cartilage.  I then placed a bent Hohmann over the medial acetabular rim and removed remnants of  the acetabular labrum and other debris and  then began reaming from a size 44 reamer and we jumped several increments and eventually finished with a size 55 reamer with all reamers under direct visualization, the last reamer under direct fluoroscopy, so I  could obtain my inclination and anteversion.  He is someone whose lesser trochanter, you cannot see well on plain films.  He is definitely a little bit more retroverted so, we want to account for that on my cup placement.  We then placed acetabular  component without difficulty and I placed a single screw.  I then placed a 36+0 neutral polyethylene liner.  Attention was then turned to the femur.  With the leg externally rotated to 120 degrees, extended and adducted, I was able to place a Mueller  retractor medially and Homans' retractor above the greater trochanter, released lateral joint capsule and used a box-cutting osteotome to enter femoral canal and a rongeur to lateralize then began broaching from a size 8 broach using Corail broaching  system going to a size 13.  With the 13 in place, we tried a varus offset femoral neck and 36+1.5 hip ball reducing the acetabulum and I felt like he needed just a little more offset and leg length.  It was stable in rotation, but radiographically  comparing to intraoperative films, he definitely needs to be just a touch longer.  We dislocated the hip and removed the trial components.  I placed the real Corail femoral component size 13 with a varus offset, and then the real 36+5 ceramic hip ball  and reduced this in the acetabulum, and was absolutely pleased with leg length, offset, range of motion and stability.  This was assessed clinically and radiographically.  We then irrigated the hip with normal saline solution using pulsatile lavage.  We  closed the joint  capsule with interrupted #1 Vicryl suture followed by 0 Vicryl in the deep tissue, 2-0 Vicryl subcutaneous tissue, 4-0 Monocryl subcuticular stitch and Steri-Strips on the skin.  Aquacel dressing was  applied.  He was taken off the Hana  table and taken to recovery room in stable condition.  All final counts were correct.    There were no complications noted.    Note Benita Stabile, PA-C, assisted the entire case.  Assistance was crucial for facilitating all aspects of this case.  AN/NUANCE  D:06/30/2018 T:06/30/2018 JOB:[id number]

## 2018-06-30 NOTE — Brief Op Note (Signed)
06/30/2018  9:58 AM  PATIENT:  Willie Hernandez  62 y.o. male  PRE-OPERATIVE DIAGNOSIS:  right hip osteoarthritis  POST-OPERATIVE DIAGNOSIS:  right hip osteoarthritis  PROCEDURE:  Procedure(s): RIGHT TOTAL HIP ARTHROPLASTY ANTERIOR APPROACH (Right)  SURGEON:  Surgeon(s) and Role:    Mcarthur Rossetti, MD - Primary  PHYSICIAN ASSISTANT: Benita Stabile, PA-C  ANESTHESIA:   spinal  EBL:  250 mL   COUNTS:  YES  DICTATION: .Other Dictation: Dictation Number [id number]  PLAN OF CARE: Admit to inpatient   PATIENT DISPOSITION:  PACU - hemodynamically stable.   Delay start of Pharmacological VTE agent (>24hrs) due to surgical blood loss or risk of bleeding: no

## 2018-06-30 NOTE — Anesthesia Procedure Notes (Signed)
Spinal  Start time: 06/30/2018 8:35 AM End time: 06/30/2018 8:41 AM Staffing Resident/CRNA: Gean Maidens, CRNA Performed: resident/CRNA  Preanesthetic Checklist Completed: patient identified, site marked, surgical consent, pre-op evaluation, timeout performed, IV checked, risks and benefits discussed and monitors and equipment checked Spinal Block Patient position: sitting Prep: DuraPrep Patient monitoring: heart rate, continuous pulse ox and blood pressure Approach: midline Location: L4-5 Injection technique: single-shot Needle Needle type: Pencan  Needle gauge: 24 G Needle length: 9 cm Needle insertion depth: 6 cm Additional Notes Pt sitting position, sterile prep and drape negative paresthesia/heme

## 2018-06-30 NOTE — Transfer of Care (Signed)
Immediate Anesthesia Transfer of Care Note  Patient: Willie Hernandez  Procedure(s) Performed: RIGHT TOTAL HIP ARTHROPLASTY ANTERIOR APPROACH (Right )  Patient Location: PACU  Anesthesia Type:Spinal  Level of Consciousness: awake, alert  and oriented  Airway & Oxygen Therapy: Patient Spontanous Breathing and Patient connected to face mask oxygen  Post-op Assessment: Report given to RN and Post -op Vital signs reviewed and stable  Post vital signs: Reviewed and stable  Last Vitals:  Vitals Value Taken Time  BP 93/63 06/30/2018 10:12 AM  Temp    Pulse 51 06/30/2018 10:14 AM  Resp 9 06/30/2018 10:14 AM  SpO2 100 % 06/30/2018 10:14 AM  Vitals shown include unvalidated device data.  Last Pain:  Vitals:   06/30/18 0728  TempSrc:   PainSc: 0-No pain      Patients Stated Pain Goal: 4 (79/98/72 1587)  Complications: No apparent anesthesia complications

## 2018-06-30 NOTE — Evaluation (Signed)
Physical Therapy Evaluation Patient Details Name: Willie Hernandez MRN: [id number] DOB: 1955-09-04 Today's Date: 06/30/2018   History of Present Illness  62 yo male s/p R THA-direct anterior 06/30/2018. Hx of bil visual impairements.   Clinical Impression  On eval POD 0, pt walked ~125 feet with a RW. Pain was rated 6/10 with activity. Pt tolerated session well. He is a little anxious about progression of activity. Anticipate pt will progress well. Plan is for d/c home with HHPT f/u.    Follow Up Recommendations Follow surgeon's recommendation for DC plan and follow-up therapies(plan is for HHPT per chart)    Equipment Recommendations  (continuing to assess)    Recommendations for Other Services       Precautions / Restrictions Precautions Precautions: Fall Restrictions Weight Bearing Restrictions: No Other Position/Activity Restrictions: WBAT      Mobility  Bed Mobility Overal bed mobility: Needs Assistance Bed Mobility: Supine to Sit     Supine to sit: Supervision;HOB elevated     General bed mobility comments: for safety.   Transfers Overall transfer level: Needs assistance Equipment used: Rolling walker (2 wheeled) Transfers: Sit to/from Stand Sit to Stand: Supervision         General transfer comment: VCs safety, hand placement.   Ambulation/Gait Ambulation/Gait assistance: Supervision Gait Distance (Feet): 125 Feet Assistive device: Rolling walker (2 wheeled) Gait Pattern/deviations: Step-to pattern;Step-through pattern;Decreased stride length     General Gait Details: Pt transitioned to step through gait pattern as distance progressed. VCs safety, sequence. No c/o dizziness/lightheadedness.   Stairs            Wheelchair Mobility    Modified Rankin (Stroke Patients Only)       Balance Overall balance assessment: Mild deficits observed, not formally tested                                           Pertinent  Vitals/Pain Pain Assessment: 0-10 Pain Score: 6  Pain Location: R hip Pain Descriptors / Indicators: Sore;Discomfort Pain Intervention(s): Monitored during session;Repositioned    Home Living Family/patient expects to be discharged to:: Private residence Living Arrangements: Spouse/significant other Available Help at Discharge: Family Type of Home: House Home Access: Stairs to enter Entrance Stairs-Rails: Right Entrance Stairs-Number of Steps: 2 Home Layout: One level Home Equipment: Environmental consultant - 4 wheels      Prior Function Level of Independence: Independent               Hand Dominance        Extremity/Trunk Assessment   Upper Extremity Assessment Upper Extremity Assessment: Defer to OT evaluation    Lower Extremity Assessment Lower Extremity Assessment: Generalized weakness(s/p R THA)    Cervical / Trunk Assessment Cervical / Trunk Assessment: Normal  Communication   Communication: No difficulties  Cognition Arousal/Alertness: Awake/alert Behavior During Therapy: WFL for tasks assessed/performed Overall Cognitive Status: Within Functional Limits for tasks assessed                                        General Comments      Exercises     Assessment/Plan    PT Assessment Patient needs continued PT services  PT Problem List Decreased strength;Decreased range of motion;Decreased balance;Decreased mobility;Pain;Decreased activity tolerance;Decreased knowledge of use of DME  PT Treatment Interventions DME instruction;Gait training;Functional mobility training;Therapeutic activities;Balance training;Patient/family education;Therapeutic exercise;Stair training    PT Goals (Current goals can be found in the Care Plan section)  Acute Rehab PT Goals Patient Stated Goal: regain PLOF and activity level PT Goal Formulation: With patient Time For Goal Achievement: 07/14/18 Potential to Achieve Goals: Good    Frequency 7X/week    Barriers to discharge        Co-evaluation               AM-PAC PT "6 Clicks" Mobility  Outcome Measure Help needed turning from your back to your side while in a flat bed without using bedrails?: A Little Help needed moving from lying on your back to sitting on the side of a flat bed without using bedrails?: A Little Help needed moving to and from a bed to a chair (including a wheelchair)?: A Little Help needed standing up from a chair using your arms (e.g., wheelchair or bedside chair)?: A Little Help needed to walk in hospital room?: A Little Help needed climbing 3-5 steps with a railing? : A Little 6 Click Score: 18    End of Session Equipment Utilized During Treatment: Gait belt Activity Tolerance: Patient tolerated treatment well Patient left: in chair;with call bell/phone within reach;with family/visitor present   PT Visit Diagnosis: Unsteadiness on feet (R26.81);Other abnormalities of gait and mobility (R26.89);Pain Pain - Right/Left: Right Pain - part of body: Hip    Time: 7741-2878 PT Time Calculation (min) (ACUTE ONLY): 32 min   Charges:   PT Evaluation $PT Eval Low Complexity: 1 Low PT Treatments $Gait Training: 8-22 mins          Weston Anna, PT Acute Rehabilitation Services Pager: [phone] Office: [phone]

## 2018-06-30 NOTE — Anesthesia Preprocedure Evaluation (Signed)
Anesthesia Evaluation  Patient identified by MRN, date of birth, ID band Patient awake    Reviewed: Allergy & Precautions, NPO status , Patient's Chart, lab work & pertinent test results  History of Anesthesia Complications (+) PONV  Airway Mallampati: II  TM Distance: >3 FB Neck ROM: Full    Dental no notable dental hx.    Pulmonary neg pulmonary ROS,    Pulmonary exam normal breath sounds clear to auscultation       Cardiovascular negative cardio ROS Normal cardiovascular exam Rhythm:Regular Rate:Normal     Neuro/Psych negative neurological ROS  negative psych ROS   GI/Hepatic Neg liver ROS, GERD  ,  Endo/Other  negative endocrine ROS  Renal/GU negative Renal ROS  negative genitourinary   Musculoskeletal  (+) Arthritis , Osteoarthritis,    Abdominal   Peds negative pediatric ROS (+)  Hematology negative hematology ROS (+)   Anesthesia Other Findings   Reproductive/Obstetrics negative OB ROS                             Anesthesia Physical Anesthesia Plan  ASA: II  Anesthesia Plan: Spinal   Post-op Pain Management:    Induction: Intravenous  PONV Risk Score and Plan: 2 and Ondansetron, Dexamethasone and Treatment may vary due to age or medical condition  Airway Management Planned: Simple Face Mask  Additional Equipment:   Intra-op Plan:   Post-operative Plan:   Informed Consent: I have reviewed the patients History and Physical, chart, labs and discussed the procedure including the risks, benefits and alternatives for the proposed anesthesia with the patient or authorized representative who has indicated his/her understanding and acceptance.   Dental advisory given  Plan Discussed with: CRNA and Surgeon  Anesthesia Plan Comments:         Anesthesia Quick Evaluation

## 2018-06-30 NOTE — H&P (Signed)
TOTAL HIP ADMISSION H&P  Patient is admitted for right total hip arthroplasty.  Subjective:  Chief Complaint: right hip pain  HPI: Willie Hernandez, 62 y.o. male, has a history of pain and functional disability in the right hip(s) due to arthritis and patient has failed non-surgical conservative treatments for greater than 12 weeks to include NSAID's and/or analgesics, corticosteriod injections, flexibility and strengthening excercises and activity modification.  Onset of symptoms was gradual starting 2 years ago with gradually worsening course since that time.The patient noted no past surgery on the right hip(s).  Patient currently rates pain in the right hip at 10 out of 10 with activity. Patient has night pain, worsening of pain with activity and weight bearing, pain that interfers with activities of daily living and pain with passive range of motion. Patient has evidence of subchondral cysts, subchondral sclerosis, periarticular osteophytes and joint space narrowing by imaging studies. This condition presents safety issues increasing the risk of falls.  There is no current active infection.  Patient Active Problem List   Diagnosis Date Noted  . Unilateral primary osteoarthritis, right hip 05/08/2018  . Hx of adenomatous colonic polyps 07/14/2017  . 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 Multiple retinal breaks of right eye 09/07/2016  . GERD (gastroesophageal reflux disease) 02/20/2013   Past Medical History:  Diagnosis Date  . Allergic rhinitis   . GERD (gastroesophageal reflux disease)   . Hallux rigidus    with heel cord tightness, Dr. Sharol Given  . Hearing loss in right ear    noise exposure  . Hx of adenomatous colonic polyps 07/14/2017  . Mitral valve prolapse    mild mitral regurgitation  . PONV (postoperative nausea and vomiting)    from Versed  . Rhegmatogenous retinal detachment of left eye   . Spermatocele    Right, Dr. Risa Grill    Past Surgical  History:  Procedure Laterality Date  . COLONOSCOPY  1997, 2006  . corrective jaw surgery  2001   for overbite  . eye lens replaced  05/27/2018   at [facility] center  . GAS INSERTION Left 09/07/2016   Procedure: INSERTION OF [id number];  Surgeon: Hayden Pedro, MD;  Location: [place];  Service: Ophthalmology;  Laterality: Left;  . LASER PHOTO ABLATION Bilateral 09/07/2016   Procedure: LASER PHOTO ABLATION RIGHT EYE;  Surgeon: Hayden Pedro, MD;  Location: [place];  Service: Ophthalmology;  Laterality: Bilateral;  . laser retinal tears OS  04/2012  . SCLERAL BUCKLE WITH CRYO Left 09/07/2016   Procedure: SCLERAL BUCKLE WITH CRYO;  Surgeon: Hayden Pedro, MD;  Location: [place];  Service: Ophthalmology;  Laterality: Left;  . UPPER GASTROINTESTINAL ENDOSCOPY    . WISDOM TOOTH EXTRACTION     age 74    Current Facility-Administered Medications  Medication Dose Route Frequency Provider Last Rate Last Dose  . ceFAZolin (ANCEF) IVPB 2g/100 mL premix  2 g Intravenous On Call to OR Pete Pelt, PA-C      . chlorhexidine (HIBICLENS) 4 % liquid 4 application  60 mL Topical Once Erskine Emery W, PA-C      . lactated ringers infusion   Intravenous Continuous Myrtie Soman, MD      . tranexamic acid (CYKLOKAPRON) IVPB 1,000 mg  1,000 mg Intravenous To OR Pete Pelt, PA-C       Allergies  Allergen Reactions  . Doxycycline Hyclate Other (See Comments)    Sores in mouth  . Sulfa Antibiotics Hives and  Other (See Comments)    flushing    Social History   Tobacco Use  . Smoking status: Never Smoker  . Smokeless tobacco: Never Used  Substance Use Topics  . Alcohol use: Yes    Comment: occasional    Family History  Problem Relation Age of Onset  . Atrial fibrillation Father   . Sudden death Father   . Congestive Heart Failure Mother   . Alzheimer's disease Mother   . Esophageal cancer Unknown        uncle  . Colon cancer Paternal Grandfather   . Colon polyps Neg Hx   . Rectal cancer Neg  Hx   . Stomach cancer Neg Hx      Review of Systems  Musculoskeletal: Positive for joint pain.  All other systems reviewed and are negative.   Objective:  Physical Exam  Constitutional: He is oriented to person, place, and time. He appears well-developed and well-nourished.  HENT:  Head: Normocephalic and atraumatic.  Eyes: Pupils are equal, round, and reactive to light.  Neck: Normal range of motion.  Cardiovascular: Normal rate.  Respiratory: Effort normal.  GI: Soft.  Musculoskeletal:       Right hip: He exhibits decreased range of motion, decreased strength, tenderness and bony tenderness.  Neurological: He is alert and oriented to person, place, and time.  Skin: Skin is warm and dry.  Psychiatric: He has a normal mood and affect.    Vital signs in last 24 hours: Temp:  [97.6 F (36.4 C)] 97.6 F (36.4 C) (12/06 0704) Pulse Rate:  [50] 50 (12/06 0704) Resp:  [18] 18 (12/06 0704) BP: (140)/(86) 140/86 (12/06 0704) SpO2:  [99 %] 99 % (12/06 0704)  Labs:   Estimated body mass index is 20.92 kg/m as calculated from the following:   Height as of 06/21/18: 5\' 11"  (1.803 m).   Weight as of 06/21/18: 68 kg.   Imaging Review Plain radiographs demonstrate severe degenerative joint disease of the right hip(s). The bone quality appears to be excellent for age and reported activity level.    Preoperative templating of the joint replacement has been completed, documented, and submitted to the Operating Room personnel in order to optimize intra-operative equipment management.     Assessment/Plan:  End stage arthritis, right hip(s)  The patient history, physical examination, clinical judgement of the provider and imaging studies are consistent with end stage degenerative joint disease of the right hip(s) and total hip arthroplasty is deemed medically necessary. The treatment options including medical management, injection therapy, arthroscopy and arthroplasty were  discussed at length. The risks and benefits of total hip arthroplasty were presented and reviewed. The risks due to aseptic loosening, infection, stiffness, dislocation/subluxation,  thromboembolic complications and other imponderables were discussed.  The patient acknowledged the explanation, agreed to proceed with the plan and consent was signed. Patient is being admitted for inpatient treatment for surgery, pain control, PT, OT, prophylactic antibiotics, VTE prophylaxis, progressive ambulation and ADL's and discharge planning.The patient is planning to be discharged home with home health services

## 2018-07-01 LAB — CBC
HEMATOCRIT: 34.7 % — AB (ref 39.0–52.0)
Hemoglobin: 11.4 g/dL — ABNORMAL LOW (ref 13.0–17.0)
MCH: 31.8 pg (ref 26.0–34.0)
MCHC: 32.9 g/dL (ref 30.0–36.0)
MCV: 96.9 fL (ref 80.0–100.0)
Platelets: 151 10*3/uL (ref 150–400)
RBC: 3.58 MIL/uL — ABNORMAL LOW (ref 4.22–5.81)
RDW: 13.2 % (ref 11.5–15.5)
WBC: 9.4 10*3/uL (ref 4.0–10.5)
nRBC: 0 % (ref 0.0–0.2)

## 2018-07-01 LAB — BASIC METABOLIC PANEL
Anion gap: 6 (ref 5–15)
BUN: 12 mg/dL (ref 8–23)
CO2: 25 mmol/L (ref 22–32)
Calcium: 8.2 mg/dL — ABNORMAL LOW (ref 8.9–10.3)
Chloride: 106 mmol/L (ref 98–111)
Creatinine, Ser: 0.52 mg/dL — ABNORMAL LOW (ref 0.61–1.24)
GFR calc Af Amer: >60 mL/min (ref >60)
GFR calc non Af Amer: >60 mL/min (ref >60)
GLUCOSE: 111 mg/dL — AB (ref 70–99)
Potassium: 4 mmol/L (ref 3.5–5.1)
Sodium: 137 mmol/L (ref 135–145)

## 2018-07-01 MED ORDER — OXYCODONE HCL 5 MG PO TABS: 5.0000 mg | ORAL_TABLET | ORAL | 0 refills | Status: DC | PRN

## 2018-07-01 MED ORDER — ASPIRIN 81 MG PO CHEW: 81.0000 mg | CHEWABLE_TABLET | Freq: Two times a day (BID) | ORAL | 0 refills | Status: DC

## 2018-07-01 MED ORDER — METHOCARBAMOL 500 MG PO TABS: 500.0000 mg | ORAL_TABLET | Freq: Four times a day (QID) | ORAL | 0 refills | Status: DC | PRN

## 2018-07-01 NOTE — Discharge Instructions (Signed)

## 2018-07-01 NOTE — Progress Notes (Signed)
Physical Therapy Treatment Patient Details Name: Willie Hernandez MRN: [id number] DOB: 03-Nov-1955 Today's Date: 07/01/2018    History of Present Illness 62 yo male s/p R THA-direct anterior 06/30/2018. Hx of bil visual impairements.     PT Comments    POD # 1 am session Assisted out of recliner to amb in hallway using a rollator as that is what he plans to use at home.  Returned to room and Performed some TE's following HEP handout.  Instructed on proper tech, freq as well as use of ICE.   Pt plans to D/C to home tomorrow.    Follow Up Recommendations  Follow surgeon's recommendation for DC plan and follow-up therapies(HH PT)     Equipment Recommendations  None recommended by PT(has a rollator at home )    Recommendations for Other Services       Precautions / Restrictions Precautions Precautions: Fall Restrictions Weight Bearing Restrictions: No Other Position/Activity Restrictions: WBAT    Mobility  Bed Mobility Overal bed mobility: Needs Assistance Bed Mobility: Supine to Sit     Supine to sit: Min assist     General bed mobility comments: OOB in recliner   Transfers Overall transfer level: Needs assistance Equipment used: 4-wheeled walker Transfers: Sit to/from [facility] Sit to Stand: Min guard;Min assist Stand pivot transfers: Min assist       General transfer comment: 50% VC's on proper hand placement avoid pulling up on walker) and safety with turn completion prior to sit.   Ambulation/Gait Ambulation/Gait assistance: Supervision;Min guard Gait Distance (Feet): 115 Feet Assistive device: 4-wheeled walker Gait Pattern/deviations: Step-through pattern Gait velocity: decreased    General Gait Details: required increased assist this session due to using 4WW vs RW with 50% VC's on safety with turns and upright posture.     Stairs             Wheelchair Mobility    Modified Rankin (Stroke Patients Only)       Balance    Total Knee Replacement TE's 10 reps B LE ankle pumps 10 reps towel squeezes 10 reps knee presses 10 reps heel slides  10 reps SAQ's 10 reps SLR's 10 reps ABD Followed by ICE                                           Cognition Arousal/Alertness: Awake/alert Behavior During Therapy: WFL for tasks assessed/performed Overall Cognitive Status: Within Functional Limits for tasks assessed                                        Exercises      General Comments        Pertinent Vitals/Pain Pain Assessment: 0-10 Pain Score: 3  Pain Location: R hip Pain Descriptors / Indicators: Aching;Operative site guarding Pain Intervention(s): Monitored during session;Repositioned;Ice applied    Home Living Family/patient expects to be discharged to:: Private residence Living Arrangements: Spouse/significant other Available Help at Discharge: Family Type of Home: House Home Access: Stairs to enter Entrance Stairs-Rails: Right Home Layout: One level Home Equipment: Environmental consultant - 4 wheels;Shower seat;Toilet riser      Prior Function Level of Independence: Independent          PT Goals (current goals can now be found in the care plan section)  Acute Rehab PT Goals Patient Stated Goal: return to work tasks.  Progress towards PT goals: Progressing toward goals    Frequency    7X/week      PT Plan Current plan remains appropriate    Co-evaluation              AM-PAC PT "6 Clicks" Mobility   Outcome Measure  Help needed turning from your back to your side while in a flat bed without using bedrails?: A Little Help needed moving from lying on your back to sitting on the side of a flat bed without using bedrails?: A Little Help needed moving to and from a bed to a chair (including a wheelchair)?: A Little Help needed standing up from a chair using your arms (e.g., wheelchair or bedside chair)?: A Little Help needed to walk in hospital room?: A  Little Help needed climbing 3-5 steps with a railing? : A Little 6 Click Score: 18    End of Session Equipment Utilized During Treatment: Gait belt Activity Tolerance: Patient tolerated treatment well Patient left: in chair;with call bell/phone within reach;with family/visitor present Nurse Communication: Mobility status PT Visit Diagnosis: Unsteadiness on feet (R26.81);Other abnormalities of gait and mobility (R26.89);Pain Pain - Right/Left: Right Pain - part of body: Hip     Time: 1050-1120 PT Time Calculation (min) (ACUTE ONLY): 30 min  Charges:  $Gait Training: 8-22 mins $Therapeutic Exercise: 8-22 mins                     Rica Koyanagi  PTA Acute  Rehabilitation Services Pager      [phone] Office      [phone]

## 2018-07-01 NOTE — Evaluation (Signed)
Occupational Therapy Evaluation Patient Details Name: Willie Hernandez MRN: [id number] DOB: 08-12-1955 Today's Date: 07/01/2018    History of Present Illness 62 yo male s/p R THA-direct anterior 06/30/2018. Hx of bil visual impairements.    Clinical Impression   Pt up with walker to perform functional mobility to the bathroom and was able to stand and use urinal in the bathroom. Pt with some nausea when exiting bathroom but pt stated he hadnt eaten breakfast yet so assisted pt to obtain water and crackers and encouraged him to call in breakfast order. Pt overall at min to mod assist level with ADL. Pt will benefit from continued OT to progress ADL independence.     Follow Up Recommendations  No OT follow up    Equipment Recommendations  3 in 1 bedside commode    Recommendations for Other Services       Precautions / Restrictions Precautions Precautions: Fall Restrictions Weight Bearing Restrictions: No Other Position/Activity Restrictions: WBAT      Mobility Bed Mobility Overal bed mobility: Needs Assistance Bed Mobility: Supine to Sit     Supine to sit: Min assist     General bed mobility comments: min assist for R LE over to EOB.   Transfers Overall transfer level: Needs assistance Equipment used: Rolling walker (2 wheeled) Transfers: Sit to/from Stand Sit to Stand: Min guard         General transfer comment: min cues for hand placement and LE management.     Balance                                           ADL either performed or assessed with clinical judgement   ADL Overall ADL's : Needs assistance/impaired Eating/Feeding: Independent;Sitting   Grooming: Wash/dry hands;Set up;Sitting   Upper Body Bathing: Set up;Sitting   Lower Body Bathing: Minimal assistance;Sit to/from stand   Upper Body Dressing : Sitting   Lower Body Dressing: Moderate assistance;Sit to/from stand   Toilet Transfer: Minimal assistance;Ambulation;RW    Toileting- Clothing Manipulation and Hygiene: Minimal assistance;Sit to/from stand         General ADL Comments: Pt wanting to use urinal in the bathroom so worked on safe functional mobility into bathroom with walker. Pt needed cues to push walker out in front of first and for proper step sequence. Pt able to tolerate standing for approximately 2 minutes to use urinal with min to min guard assist.  Pt started to feel a littel nauseous with exiting bathroom but he stated he hadnt had breakfast yet. Helped pt with obtaining cup of water and some packs of graham crackers and encouraged pt to order breakfast when therapy concluded. Educated pt on 3in1 option and he would like to obtain one.      Vision Patient Visual Report: No change from baseline       Perception     Praxis      Pertinent Vitals/Pain Pain Assessment: 0-10 Pain Score: 6  Pain Location: R hip Pain Descriptors / Indicators: Aching Pain Intervention(s): Monitored during session;Ice applied     Hand Dominance     Extremity/Trunk Assessment Upper Extremity Assessment Upper Extremity Assessment: Overall WFL for tasks assessed           Communication Communication Communication: No difficulties   Cognition Arousal/Alertness: Awake/alert Behavior During Therapy: WFL for tasks assessed/performed Overall Cognitive Status: Within Functional Limits for  tasks assessed                                     General Comments       Exercises     Shoulder Instructions      Home Living Family/patient expects to be discharged to:: Private residence Living Arrangements: Spouse/significant other Available Help at Discharge: Family Type of Home: House Home Access: Stairs to enter Technical brewer of Steps: 2 Entrance Stairs-Rails: Right Home Layout: One level     Bathroom Shower/Tub: Occupational psychologist: [place]: Environmental consultant - 4 wheels;Shower seat;Toilet  riser          Prior Functioning/Environment Level of Independence: Independent                 OT Problem List: Decreased strength;Decreased knowledge of use of DME or AE      OT Treatment/Interventions: Self-care/ADL training;DME and/or AE instruction;Therapeutic activities;Patient/family education    OT Goals(Current goals can be found in the care plan section) Acute Rehab OT Goals Patient Stated Goal: return to work tasks.  OT Goal Formulation: With patient Time For Goal Achievement: 07/08/18 Potential to Achieve Goals: Good  OT Frequency: Min 2X/week   Barriers to D/C:            Co-evaluation              AM-PAC OT "6 Clicks" Daily Activity     Outcome Measure Help from another person eating meals?: None Help from another person taking care of personal grooming?: A Little Help from another person toileting, which includes using toliet, bedpan, or urinal?: A Little Help from another person bathing (including washing, rinsing, drying)?: A Little Help from another person to put on and taking off regular upper body clothing?: A Little Help from another person to put on and taking off regular lower body clothing?: A Little 6 Click Score: 19   End of Session Equipment Utilized During Treatment: Gait belt;Rolling walker  Activity Tolerance: Patient tolerated treatment well Patient left: in chair;with call bell/phone within reach  OT Visit Diagnosis: Unsteadiness on feet (R26.81);Muscle weakness (generalized) (M62.81)                Time: 0930-1004 OT Time Calculation (min): 34 min Charges:  OT Evaluation $OT Eval Low Complexity: 1 Low OT Treatments $Therapeutic Activity: 8-22 mins   Pauline Aus OTR/L Acute Rehabilitation [phone] office number    07/01/2018, 10:28 AM

## 2018-07-01 NOTE — Progress Notes (Signed)
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 Hx of bil visual impairements.     PT Comments    POD # 1 pm session Pt more "foggy" "loopy" this afternoon (meds?) but his pain is better managed.  Spouse present during session.  Assisted with amb to bathroom to static stand void.  Assisted with amb in hallway using 4 [place]  because "thats what pt wants to use" however too unsteady and drifting R/L so switched him back to regular RW and instructed pt and spouse increased stability/safety. Assisted back to bed and Performed some TE's following HEP handout.  Instructed on proper tech, freq as well as use of ICE.  Assisted OOB to staic stand a second time to void then back to bed.  Pt plans to D/C to home tomorrow.  Will need to perform stair training with spouse prior to D/C.    Follow Up Recommendations  Follow surgeon's recommendation for DC plan and follow-up therapies(HH PT)     Equipment Recommendations  Rolling walker with 5" wheels(spouse renting 4 WW and agrees to regular RW)    Recommendations for Other Services       Precautions / Restrictions Precautions Precautions: Fall Restrictions Weight Bearing Restrictions: No Other Position/Activity Restrictions: WBAT    Mobility  Bed Mobility Overal bed mobility: Needs Assistance Bed Mobility: Supine to Sit;Sit to Supine     Supine to sit: Min assist Sit to supine: Min assist   General bed mobility comments: assiste and support R LE on/off bed and assist with positioning to comfort  Transfers Overall transfer level: Needs assistance Equipment used: Rolling walker (2 wheeled);4-wheeled walker Transfers: Sit to/from [facility] Sit to Stand: Min guard;Min assist Stand pivot transfers: Min assist       General transfer comment: 50% VC's on proper hand  placement avoid pulling up on walker) and safety with turn completion prior to sit.   Ambulation/Gait Ambulation/Gait assistance: Supervision;Min guard Gait Distance (Feet): 125 Feet Assistive device: Rolling walker (2 wheeled);4-wheeled walker Gait Pattern/deviations: Step-to pattern;Step-through pattern Gait velocity: decreased    General Gait Details: amb with 4 ww because "thats what pt wants to use" however too unsteady and drifting R/L so switched him back to regular RW and instructed pt and spouse increased stability/safety.    Stairs             Wheelchair Mobility    Modified Rankin (Stroke Patients Only)       Balance                                            Cognition Arousal/Alertness: Awake/alert Behavior During Therapy: WFL for tasks assessed/performed Overall Cognitive Status: Within Functional Limits for tasks assessed                                 General Comments: slightly more "foggy" this afternoon (meds)      Exercises   Total Hip Replacement TE's 10 reps ankle pumps 10 reps knee presses 10 reps heel slides 10 reps SAQ's 10 reps ABD Followed by ICE     General Comments        Pertinent Vitals/Pain Pain Assessment: 0-10  Pain Score: 6  Pain Location: R hip Pain Descriptors / Indicators: Aching;Operative site guarding Pain Intervention(s): Monitored during session;Repositioned;Premedicated before session;Ice applied    Home Living                      Prior Function            PT Goals (current goals can now be found in the care plan section) Progress towards PT goals: Progressing toward goals    Frequency    7X/week      PT Plan Current plan remains appropriate    Co-evaluation              AM-PAC PT "6 Clicks" Mobility   Outcome Measure  Help needed turning from your back to your side while in a flat bed without using bedrails?: A Little Help needed moving from  lying on your back to sitting on the side of a flat bed without using bedrails?: A Little Help needed moving to and from a bed to a chair (including a wheelchair)?: A Little Help needed standing up from a chair using your arms (e.g., wheelchair or bedside chair)?: A Little Help needed to walk in hospital room?: A Little Help needed climbing 3-5 steps with a railing? : A Lot 6 Click Score: 17    End of Session Equipment Utilized During Treatment: Gait belt Activity Tolerance: Patient tolerated treatment well Patient left: in bed;with call bell/phone within reach;with bed alarm set;with family/visitor present Nurse Communication: Mobility status PT Visit Diagnosis: Unsteadiness on feet (R26.81);Other abnormalities of gait and mobility (R26.89);Pain Pain - Right/Left: Right Pain - part of body: Hip     Time: 1630-1710 PT Time Calculation (min) (ACUTE ONLY): 40 min  Charges:  $Gait Training: 8-22 mins $Therapeutic Exercise: 8-22 mins $Therapeutic Activity: 8-22 mins                     Rica Koyanagi  PTA Acute  Rehabilitation Services Pager      [phone] Office      [phone]

## 2018-07-01 NOTE — Progress Notes (Signed)
Subjective: 1 Day Post-Op Procedure(s) (LRB): RIGHT TOTAL HIP ARTHROPLASTY ANTERIOR APPROACH (Right) Patient reports pain as moderate.    Objective: Vital signs in last 24 hours: Temp:  [96.2 F (35.7 C)-97.9 F (36.6 C)] 97.7 F (36.5 C) (12/07 1006) Pulse Rate:  [44-73] 73 (12/07 1006) Resp:  [8-18] 16 (12/07 1006) BP: (108-130)/(69-87) 108/72 (12/07 1006) SpO2:  [98 %-100 %] 98 % (12/07 1006)  Intake/Output from previous day: 12/06 0701 - 12/07 0700 In: 3830.8 [P.O.:600; I.V.:3130.8; IV Piggyback:100] Out: 3016 [Urine:3435; Blood:250] Intake/Output this shift: Total I/O In: 540 [P.O.:240; I.V.:300] Out: 50 [Urine:50]  Recent Labs    07/01/18 0342  HGB 11.4*   Recent Labs    07/01/18 0342  WBC 9.4  RBC 3.58*  HCT 34.7*  PLT 151   Recent Labs    07/01/18 0342  NA 137  K 4.0  CL 106  CO2 25  BUN 12  CREATININE 0.52*  GLUCOSE 111*  CALCIUM 8.2*   No results for input(s): LABPT, INR in the last 72 hours.  Sensation intact distally Intact pulses distally Dorsiflexion/Plantar flexion intact Incision: scant drainage   Assessment/Plan: 1 Day Post-Op Procedure(s) (LRB): RIGHT TOTAL HIP ARTHROPLASTY ANTERIOR APPROACH (Right) Up with therapy Plan for discharge tomorrow Discharge home with home health    Mcarthur Rossetti 07/01/2018, 11:00 AM

## 2018-07-02 NOTE — Progress Notes (Signed)
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 Hx of bil visual impairements.    OT comments  Patient was educated on safe transfer to commode with use of 3-1 and to kick out leg. Patient was educated on safe transfer to walk in shower. Patient shower is setup differently and patient wanted to wait and work with [facility] to perform shower transfer. Patient was educated on use of AE for LE adls and would benefit from additional OT to address and increase I.   Follow Up Recommendations  Home health OT    Equipment Recommendations       Recommendations for Other Services      Precautions / Restrictions Precautions Precautions: Fall Restrictions Other Position/Activity Restrictions: (wbat)       Mobility Bed Mobility   Bed Mobility: Supine to Sit     Supine to sit: Supervision        Transfers       Sit to Stand: Min guard(min guard assist from bed and s from 3-1 commode) Stand pivot transfers: Min guard       General transfer comment: cues for proper hand placement and to kick out le    Balance                                           ADL either performed or assessed with clinical judgement   ADL       Grooming: Wash/dry hands;Oral care;Supervision/safety;Standing               Lower Body Dressing: Moderate assistance;With adaptive equipment;Sit to/from stand   Toilet Transfer: Supervision/safety;Ambulation;BSC   Toileting- Water quality scientist and Hygiene: Supervision/safety;Sit to/from stand         General ADL Comments: patient was educated on use of ae for le adls and use of 3-1 commode for toilet and walk in shower.      Vision       Perception     Praxis      Cognition Arousal/Alertness: Awake/alert Behavior During Therapy: WFL for tasks  assessed/performed Overall Cognitive Status: Within Functional Limits for tasks assessed                                          Exercises     Shoulder Instructions       General Comments      Pertinent Vitals/ Pain       Pain Assessment: 0-10 Pain Score: 3  Pain Location: r hip Pain Descriptors / Indicators: Aching;Operative site guarding Pain Intervention(s): Monitored during session  Home Living                                          Prior Functioning/Environment              Frequency           Progress Toward Goals  OT Goals(current goals can now be found in the care plan section)  Progress towards OT goals: Progressing toward goals  Acute Rehab OT Goals Patient Stated Goal: go  home today  Plan      Co-evaluation                 AM-PAC OT "6 Clicks" Daily Activity     Outcome Measure   Help from another person eating meals?: None Help from another person taking care of personal grooming?: A Little Help from another person toileting, which includes using toliet, bedpan, or urinal?: A Little Help from another person bathing (including washing, rinsing, drying)?: A Little Help from another person to put on and taking off regular upper body clothing?: A Little Help from another person to put on and taking off regular lower body clothing?: A Little 6 Click Score: 19    End of Session Equipment Utilized During Treatment: Gait belt;Rolling walker      Activity Tolerance Patient tolerated treatment well   Patient Left in chair;with call bell/phone within reach   Nurse Communication (ok theraphy)        Time: 4098-1191 OT Time Calculation (min): 34 min  Charges: OT Treatments $Self Care/Home Management : 47-82 mins  6 clicks   Quida Glasser 07/02/2018, 9:16 AM

## 2018-07-02 NOTE — Progress Notes (Signed)
Patient ID: Willie Hernandez, male   DOB: 1956/06/10, 62 y.o.   MRN: [id number] Patient comfortable this morning without complaints ready for discharge will discharge after he speaks with the caseworker.

## 2018-07-02 NOTE — Discharge Summary (Signed)
Discharge Diagnoses:  Principal Problem:   Unilateral primary osteoarthritis, right hip Active Problems:   Status post total replacement of right hip   Surgeries: Procedure(s): RIGHT TOTAL HIP ARTHROPLASTY ANTERIOR APPROACH on 06/30/2018    Consultants:   Discharged Condition: Improved  Hospital Course: Willie Hernandez is an 62 y.o. male who was admitted 06/30/2018 with a chief complaint of right hip osteoarthritis, with a final diagnosis of right hip osteoarthritis.  Patient was brought to the operating room on 06/30/2018 and underwent Procedure(s): RIGHT TOTAL HIP ARTHROPLASTY ANTERIOR APPROACH.    Patient was given perioperative antibiotics:  Anti-infectives (From admission, onward)   Start     Dose/Rate Route Frequency Ordered Stop   06/30/18 1500  ceFAZolin (ANCEF) IVPB 1 g/50 mL premix     1 g 100 mL/hr over 30 Minutes Intravenous Every 6 hours 06/30/18 1229 06/30/18 2100   06/30/18 0700  ceFAZolin (ANCEF) IVPB 2g/100 mL premix     2 g 200 mL/hr over 30 Minutes Intravenous On call to O.R. 06/30/18 5638 06/30/18 0916    .  Patient was given sequential compression devices, early ambulation, and aspirin for DVT prophylaxis.  Recent vital signs:  Patient Vitals for the past 24 hrs:  BP Temp Temp src Pulse Resp SpO2  07/02/18 0510 112/64 99.8 F (37.7 C) Oral 62 12 92 %  07/02/18 0047 117/61 99.8 F (37.7 C) Oral 63 16 96 %  07/01/18 1418 133/71 97.9 F (36.6 C) Oral 71 18 100 %  07/01/18 1006 108/72 97.7 F (36.5 C) Oral 73 16 98 %  .  Recent laboratory studies: Dg Pelvis Portable  Result Date: 06/30/2018 CLINICAL DATA:  Status post right hip arthroplasty. EXAM: PORTABLE PELVIS 1-2 VIEWS COMPARISON:  Earlier today. FINDINGS: Right total hip prosthesis in satisfactory position and alignment. No fracture or dislocation seen. IMPRESSION: Satisfactory postoperative appearance of a right total hip prosthesis. Electronically Signed   By: Claudie Revering M.D.   On: 06/30/2018  10:52   Dg C-arm 1-60 Min-no Report  Result Date: 06/30/2018 Fluoroscopy was utilized by the requesting physician.  No radiographic interpretation.   Dg Hip Operative Unilat W Or W/o Pelvis Right  Result Date: 06/30/2018 CLINICAL DATA:  Intraoperative images from right hip arthroplasty. EXAM: OPERATIVE RIGHT HIP (WITH PELVIS IF PERFORMED) TECHNIQUE: Fluoroscopic spot image(s) were submitted for interpretation post-operatively. COMPARISON:  04/17/2018 FINDINGS: Intraoperative fluoroscopic images from right hip arthroplasty demonstrate placement of long stem femoral component and screwed in acetabular component with normal alignment. No fractures seen. Fluoroscopy time is recorded at 16 seconds. IMPRESSION: Intraoperative fluoroscopic images from right hip arthroplasty without evidence of immediate complications. Electronically Signed   By: Fidela Salisbury M.D.   On: 06/30/2018 10:44    Discharge Medications:   Allergies as of 07/02/2018      Reactions   Doxycycline Hyclate Other (See Comments)   Sores in mouth   Sulfa Antibiotics Hives, Other (See Comments)   flushing      Medication List    STOP taking these medications   nabumetone 750 MG tablet Commonly known as:  RELAFEN     TAKE these medications   aspirin 81 MG chewable tablet Chew 1 tablet (81 mg total) by mouth 2 (two) times daily.   cholecalciferol 1000 units tablet Commonly known as:  VITAMIN D Take 1,000 Units by mouth daily.   famotidine 20 MG tablet Commonly known as:  PEPCID Take 20 mg by mouth daily as needed for heartburn or indigestion.  fexofenadine 180 MG tablet Commonly known as:  ALLEGRA Take 180 mg by mouth at bedtime.   Flaxseed (Linseed) 1000 MG Caps Take 1,000 mg by mouth daily.   fluticasone 50 MCG/ACT nasal spray Commonly known as:  FLONASE Place 1 spray into both nostrils daily.   latanoprost 0.005 % ophthalmic solution Commonly known as:  XALATAN Place 1 drop into the left eye at  bedtime.   methocarbamol 500 MG tablet Commonly known as:  ROBAXIN Take 1 tablet (500 mg total) by mouth every 6 (six) hours as needed for muscle spasms.   moxifloxacin 0.5 % ophthalmic solution Commonly known as:  VIGAMOX Place 1 drop into the left eye 3 (three) times daily.   oxyCODONE 5 MG immediate release tablet Commonly known as:  Oxy IR/ROXICODONE Take 1-2 tablets (5-10 mg total) by mouth every 4 (four) hours as needed for moderate pain (pain score 4-6).   prednisoLONE acetate 1 % ophthalmic suspension Commonly known as:  PRED FORTE Place 1 drop into the left eye 3 (three) times daily.   vitamin C 500 MG tablet Commonly known as:  ASCORBIC ACID Take 500 mg by mouth daily.            Durable Medical Equipment  (From admission, onward)         Start     Ordered   06/30/18 1230  DME Walker rolling  Once    Question:  Patient needs a walker to treat with the following condition  Answer:  Status post total replacement of right hip   06/30/18 1229   06/30/18 1230  DME 3 n 1  Once     06/30/18 1229          Diagnostic Studies: Dg Pelvis Portable  Result Date: 06/30/2018 CLINICAL DATA:  Status post right hip arthroplasty. EXAM: PORTABLE PELVIS 1-2 VIEWS COMPARISON:  Earlier today. FINDINGS: Right total hip prosthesis in satisfactory position and alignment. No fracture or dislocation seen. IMPRESSION: Satisfactory postoperative appearance of a right total hip prosthesis. Electronically Signed   By: Claudie Revering M.D.   On: 06/30/2018 10:52   Dg C-arm 1-60 Min-no Report  Result Date: 06/30/2018 Fluoroscopy was utilized by the requesting physician.  No radiographic interpretation.   Dg Hip Operative Unilat W Or W/o Pelvis Right  Result Date: 06/30/2018 CLINICAL DATA:  Intraoperative images from right hip arthroplasty. EXAM: OPERATIVE RIGHT HIP (WITH PELVIS IF PERFORMED) TECHNIQUE: Fluoroscopic spot image(s) were submitted for interpretation post-operatively.  COMPARISON:  04/17/2018 FINDINGS: Intraoperative fluoroscopic images from right hip arthroplasty demonstrate placement of long stem femoral component and screwed in acetabular component with normal alignment. No fractures seen. Fluoroscopy time is recorded at 16 seconds. IMPRESSION: Intraoperative fluoroscopic images from right hip arthroplasty without evidence of immediate complications. Electronically Signed   By: Fidela Salisbury M.D.   On: 06/30/2018 10:44    Patient benefited maximally from their hospital stay and there were no complications.     Disposition: Discharge disposition: 01-Home or Self Care      Discharge Instructions    Call MD / Call 911   Complete by:  As directed    If you experience chest pain or shortness of breath, CALL 911 and be transported to the hospital emergency room.  If you develope a fever above 101 F, pus (white drainage) or increased drainage or redness at the wound, or calf pain, call your surgeon's office.   Constipation Prevention   Complete by:  As directed    Drink  plenty of fluids.  Prune juice may be helpful.  You may use a stool softener, such as Colace (over the counter) 100 mg twice a day.  Use MiraLax (over the counter) for constipation as needed.   Diet - low sodium heart healthy   Complete by:  As directed    Increase activity slowly as tolerated   Complete by:  As directed      Follow-up Information    Mcarthur Rossetti, MD Follow up in 2 week(s).   Specialty:  Orthopedic Surgery Contact information: [place] [address] [phone]            Signed: Newt Minion 07/02/2018, 8:59 AM

## 2018-07-02 NOTE — Care Management Note (Signed)
Case Management Note  Patient Details  Name: Willie Hernandez MRN: [id number] Date of Birth: 1955/10/19  Subjective/Objective:  S/p R THA direct anterior                  Action/Plan:  NCM spoke to pt at bedside. Offered choice for HH/CMS list provided/placed on chart. Pt agreeable to [facility] for [place]. Contacted [facility] for RW and 3n1 bedside commode for home to be delivered to room.    Expected Discharge Date:  07/02/18               Expected Discharge Plan:  [place]  In-House Referral:  NA  Discharge planning Services  CM Consult  Post Acute Care Choice:  Home Health Choice offered to:  Patient  DME Arranged:  3-N-1, Walker rolling DME Agency:  [place]:  PT [place] Agency:  [facility]  Status of Service:  Completed, signed off  If discussed at [employer], dates discussed:    Additional Comments:  Erenest Rasher, RN 07/02/2018, 2:35 PM

## 2018-07-02 NOTE — Progress Notes (Signed)
Physical Therapy Treatment Patient Details Name: Willie Hernandez MRN: [id number] DOB: 10-10-1955 Today's Date: 07/02/2018    History of Present Illness 62 yo male s/p R THA-direct anterior 06/30/2018. Hx of bil visual impairements.     PT Comments    Progressing with mobility. Reviewed/practiced exercises, gait training and stair training. Encouraged pt to mobilize often at home. All education completed. Okay to d/c from PT standpoint-made RN aware.    Follow Up Recommendations  Follow surgeon's recommendation for DC plan and follow-up therapies     Equipment Recommendations       Recommendations for Other Services       Precautions / Restrictions Precautions Precautions: Fall Restrictions Weight Bearing Restrictions: No Other Position/Activity Restrictions: WBAT    Mobility  Bed Mobility               General bed mobility comments: oob in recliner  Transfers Overall transfer level: Needs assistance Equipment used: Rolling walker (2 wheeled) Transfers: Sit to/from Stand Sit to Stand: Min guard         General transfer comment: for safety. VCs safety, hand/LE placement.   Ambulation/Gait Ambulation/Gait assistance: Min guard Gait Distance (Feet): 100 Feet Assistive device: Rolling walker (2 wheeled) Gait Pattern/deviations: Step-to pattern;Step-through pattern;Decreased stride length     General Gait Details: continued use of standard RW for safety-pt does well with it. close guard for safety. slow gait speed.    Stairs Stairs: Yes Stairs assistance: Min guard Stair Management: Sideways;One rail Right Number of Stairs: 2 General stair comments: close guard for safety. VCs safety, technique, sequence.    Wheelchair Mobility    Modified Rankin (Stroke Patients Only)       Balance Overall balance assessment: Mild deficits observed, not formally tested                                          Cognition Arousal/Alertness:  Awake/alert Behavior During Therapy: WFL for tasks assessed/performed Overall Cognitive Status: Within Functional Limits for tasks assessed                                        Exercises Total Joint Exercises Hip ABduction/ADduction: AROM;Right;10 reps;Standing Long Arc Quad: AROM;Right;10 reps;Seated Knee Flexion: AROM;10 reps;Standing;Right Marching in Standing: AROM;Both;10 reps;Standing General Exercises - Lower Extremity Heel Raises: AROM;Both;10 reps;Standing    General Comments        Pertinent Vitals/Pain Pain Assessment: 0-10 Pain Score: 5  Pain Location: r hip Pain Descriptors / Indicators: Sore;Sharp;Discomfort Pain Intervention(s): Monitored during session;Repositioned;Ice applied    Home Living                      Prior Function            PT Goals (current goals can now be found in the care plan section) Progress towards PT goals: Progressing toward goals    Frequency    7X/week      PT Plan Current plan remains appropriate    Co-evaluation              AM-PAC PT "6 Clicks" Mobility   Outcome Measure  Help needed turning from your back to your side while in a flat bed without using bedrails?: A Little Help needed moving from lying on your  back to sitting on the side of a flat bed without using bedrails?: A Little Help needed moving to and from a bed to a chair (including a wheelchair)?: A Little Help needed standing up from a chair using your arms (e.g., wheelchair or bedside chair)?: A Little Help needed to walk in hospital room?: A Little Help needed climbing 3-5 steps with a railing? : A Little 6 Click Score: 18    End of Session Equipment Utilized During Treatment: Gait belt Activity Tolerance: Patient tolerated treatment well Patient left: in chair;with call bell/phone within reach   PT Visit Diagnosis: Unsteadiness on feet (R26.81);Other abnormalities of gait and mobility (R26.89);Pain Pain -  Right/Left: Right Pain - part of body: Hip     Time: 8850-2774 PT Time Calculation (min) (ACUTE ONLY): 27 min  Charges:  $Gait Training: 8-22 mins $Therapeutic Exercise: 8-22 mins                        Weston Anna, PT Acute Rehabilitation Services Pager: [phone] Office: [phone]

## 2018-07-03 ENCOUNTER — Encounter (HOSPITAL_COMMUNITY): Payer: Self-pay | Admitting: Orthopaedic Surgery

## 2018-07-03 DIAGNOSIS — H33331 Multiple defects of retina without detachment, right eye: Secondary | ICD-10-CM | POA: Diagnosis not present

## 2018-07-03 DIAGNOSIS — H33002 Unspecified retinal detachment with retinal break, left eye: Secondary | ICD-10-CM | POA: Diagnosis not present

## 2018-07-03 DIAGNOSIS — Z471 Aftercare following joint replacement surgery: Secondary | ICD-10-CM | POA: Diagnosis not present

## 2018-07-04 ENCOUNTER — Other Ambulatory Visit (INDEPENDENT_AMBULATORY_CARE_PROVIDER_SITE_OTHER): Payer: Self-pay | Admitting: Orthopaedic Surgery

## 2018-07-04 ENCOUNTER — Telehealth (INDEPENDENT_AMBULATORY_CARE_PROVIDER_SITE_OTHER): Payer: Self-pay | Admitting: Orthopaedic Surgery

## 2018-07-04 NOTE — Telephone Encounter (Signed)
Evelina Dun from [facility] called to request VO for [facility] PT for 3x a week for 2 weeks for Strengthening, transfers and ambulation.  CB#[phone].  Thank you.

## 2018-07-04 NOTE — Telephone Encounter (Signed)
IC and advised.  

## 2018-07-05 DIAGNOSIS — H33331 Multiple defects of retina without detachment, right eye: Secondary | ICD-10-CM | POA: Diagnosis not present

## 2018-07-05 DIAGNOSIS — H33002 Unspecified retinal detachment with retinal break, left eye: Secondary | ICD-10-CM | POA: Diagnosis not present

## 2018-07-05 DIAGNOSIS — Z471 Aftercare following joint replacement surgery: Secondary | ICD-10-CM | POA: Diagnosis not present

## 2018-07-07 DIAGNOSIS — Z471 Aftercare following joint replacement surgery: Secondary | ICD-10-CM | POA: Diagnosis not present

## 2018-07-07 DIAGNOSIS — H33002 Unspecified retinal detachment with retinal break, left eye: Secondary | ICD-10-CM | POA: Diagnosis not present

## 2018-07-07 DIAGNOSIS — H33331 Multiple defects of retina without detachment, right eye: Secondary | ICD-10-CM | POA: Diagnosis not present

## 2018-07-10 DIAGNOSIS — Z471 Aftercare following joint replacement surgery: Secondary | ICD-10-CM | POA: Diagnosis not present

## 2018-07-10 DIAGNOSIS — H33002 Unspecified retinal detachment with retinal break, left eye: Secondary | ICD-10-CM | POA: Diagnosis not present

## 2018-07-10 DIAGNOSIS — H33331 Multiple defects of retina without detachment, right eye: Secondary | ICD-10-CM | POA: Diagnosis not present

## 2018-07-12 DIAGNOSIS — H33002 Unspecified retinal detachment with retinal break, left eye: Secondary | ICD-10-CM | POA: Diagnosis not present

## 2018-07-12 DIAGNOSIS — H33331 Multiple defects of retina without detachment, right eye: Secondary | ICD-10-CM | POA: Diagnosis not present

## 2018-07-12 DIAGNOSIS — Z471 Aftercare following joint replacement surgery: Secondary | ICD-10-CM | POA: Diagnosis not present

## 2018-07-13 ENCOUNTER — Ambulatory Visit (INDEPENDENT_AMBULATORY_CARE_PROVIDER_SITE_OTHER): Payer: 59 | Admitting: Orthopaedic Surgery

## 2018-07-13 ENCOUNTER — Encounter (INDEPENDENT_AMBULATORY_CARE_PROVIDER_SITE_OTHER): Payer: Self-pay | Admitting: Orthopaedic Surgery

## 2018-07-13 DIAGNOSIS — H33331 Multiple defects of retina without detachment, right eye: Secondary | ICD-10-CM | POA: Diagnosis not present

## 2018-07-13 DIAGNOSIS — Z96641 Presence of right artificial hip joint: Secondary | ICD-10-CM

## 2018-07-13 DIAGNOSIS — Z471 Aftercare following joint replacement surgery: Secondary | ICD-10-CM | POA: Diagnosis not present

## 2018-07-13 DIAGNOSIS — H33002 Unspecified retinal detachment with retinal break, left eye: Secondary | ICD-10-CM | POA: Diagnosis not present

## 2018-07-13 NOTE — Progress Notes (Signed)
HPI: Willie Hernandez returns today status post right total hip arthroplasty 06/30/2018.  Overall doing well.  Is having less pain and is having preop.  He is ambulating with a cane.  Is taking no pain medications.  Occasional Robaxin.  Taking 81 mg aspirin twice daily he was on no aspirin prior to surgery.  Review of systems: No chest pain shortness of breath fevers chills nausea or vomiting.  Physical exam: Right hip surgical incisions healing well no signs of infection.  Right calf supple nontender.  Good range of motion of the right hip without pain.  Impression: Status post right total hip arthroplasty 06/30/2018  Plan: He will current continue to work on range of motion strengthening will take aspirin 81 mg once daily for another week and then discontinue.  We will follow-up with [place] in 1 month sooner if there is any questions or concerns.  Scar tissue mobilization was discussed with the patient.  He is able to shower and get the incision wet at this point time.

## 2018-08-10 ENCOUNTER — Encounter (INDEPENDENT_AMBULATORY_CARE_PROVIDER_SITE_OTHER): Payer: Self-pay | Admitting: Orthopaedic Surgery

## 2018-08-10 ENCOUNTER — Ambulatory Visit (INDEPENDENT_AMBULATORY_CARE_PROVIDER_SITE_OTHER): Payer: 59 | Admitting: Orthopaedic Surgery

## 2018-08-10 DIAGNOSIS — Z96641 Presence of right artificial hip joint: Secondary | ICD-10-CM

## 2018-08-10 NOTE — Progress Notes (Signed)
The patient is between 5 and 6 weeks status post a right total hip arthroplasty.  He is making great progress and reports that he is doing well overall.  On exam he tolerates me putting his right hip through internal and external rotation without difficulty at all.  His incisions well-healed.  His leg lengths are equal.  He has had some sensations of something moving in his hip but he says this is rare and he does not feel like it is coming out of place.  He said is hard to describe but overall there is no discomfort at all.  He is able to cross his legs easily and is getting his shoes and socks on easier.  At this point to continue increase his activities as comfort allows.  I do not need to see him back for 6 months.  At that visit I like a standing low AP pelvis and a lateral of his right operative hip.

## 2018-09-05 ENCOUNTER — Other Ambulatory Visit (INDEPENDENT_AMBULATORY_CARE_PROVIDER_SITE_OTHER): Payer: Self-pay | Admitting: Orthopaedic Surgery

## 2018-09-23 DIAGNOSIS — Z23 Encounter for immunization: Secondary | ICD-10-CM | POA: Diagnosis not present

## 2018-10-16 IMAGING — CR DG KNEE COMPLETE 4+V*R*
4 series · 4 of 4 positions shown · non-contrast
Comparison: None.

CLINICAL DATA: 62-year-old male with chronic right lateral hip pain
extending to knee for the past 5 months. No known injury. Initial
encounter.

EXAM:
RIGHT KNEE - COMPLETE 4+ VIEW

[t knee ap right]
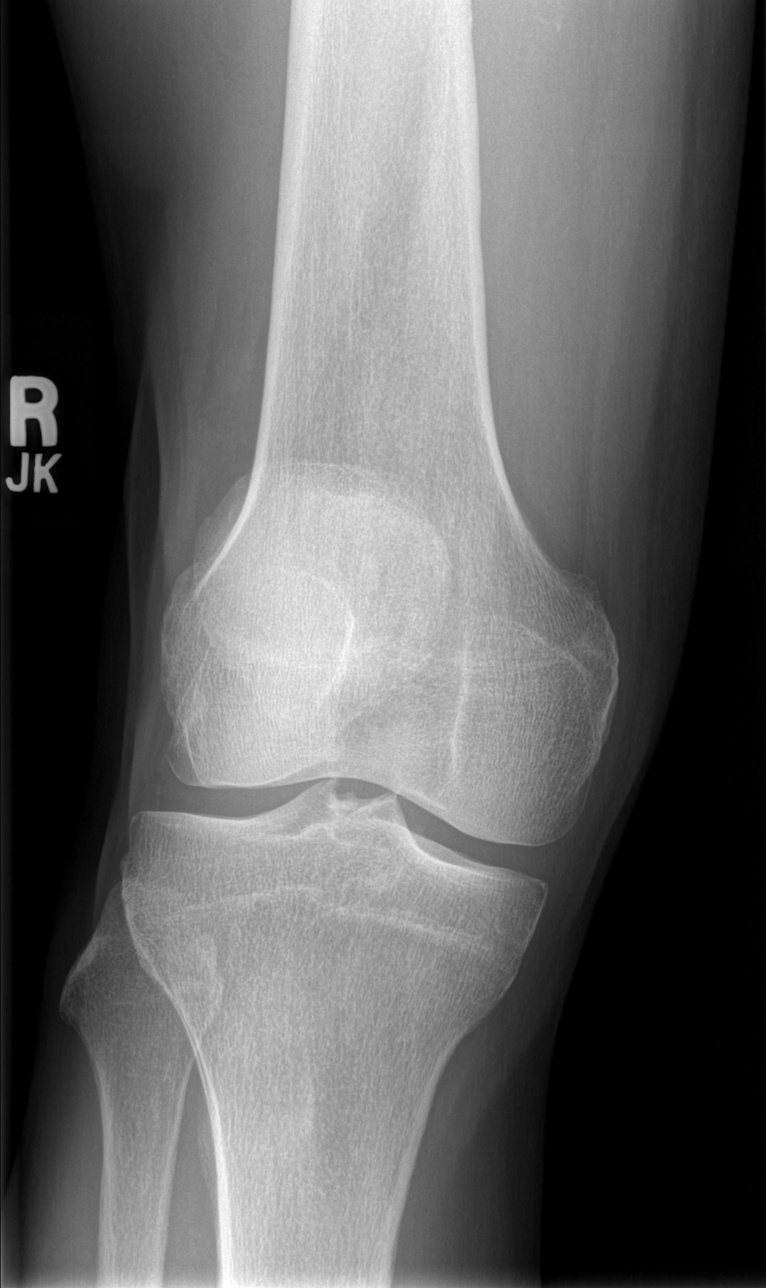

[t knee oblique right (1 of 2)]
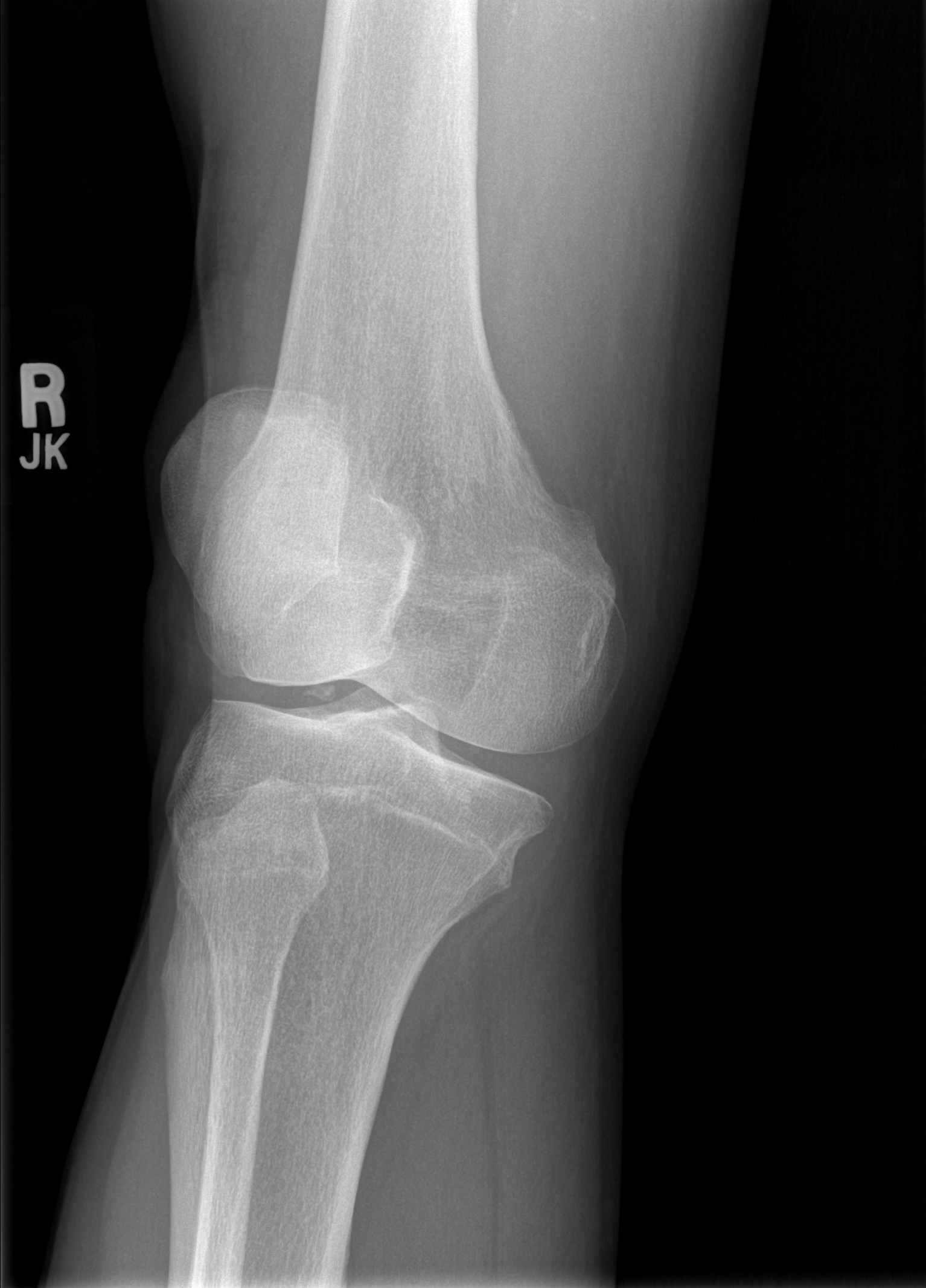

[t knee oblique right (2 of 2)]
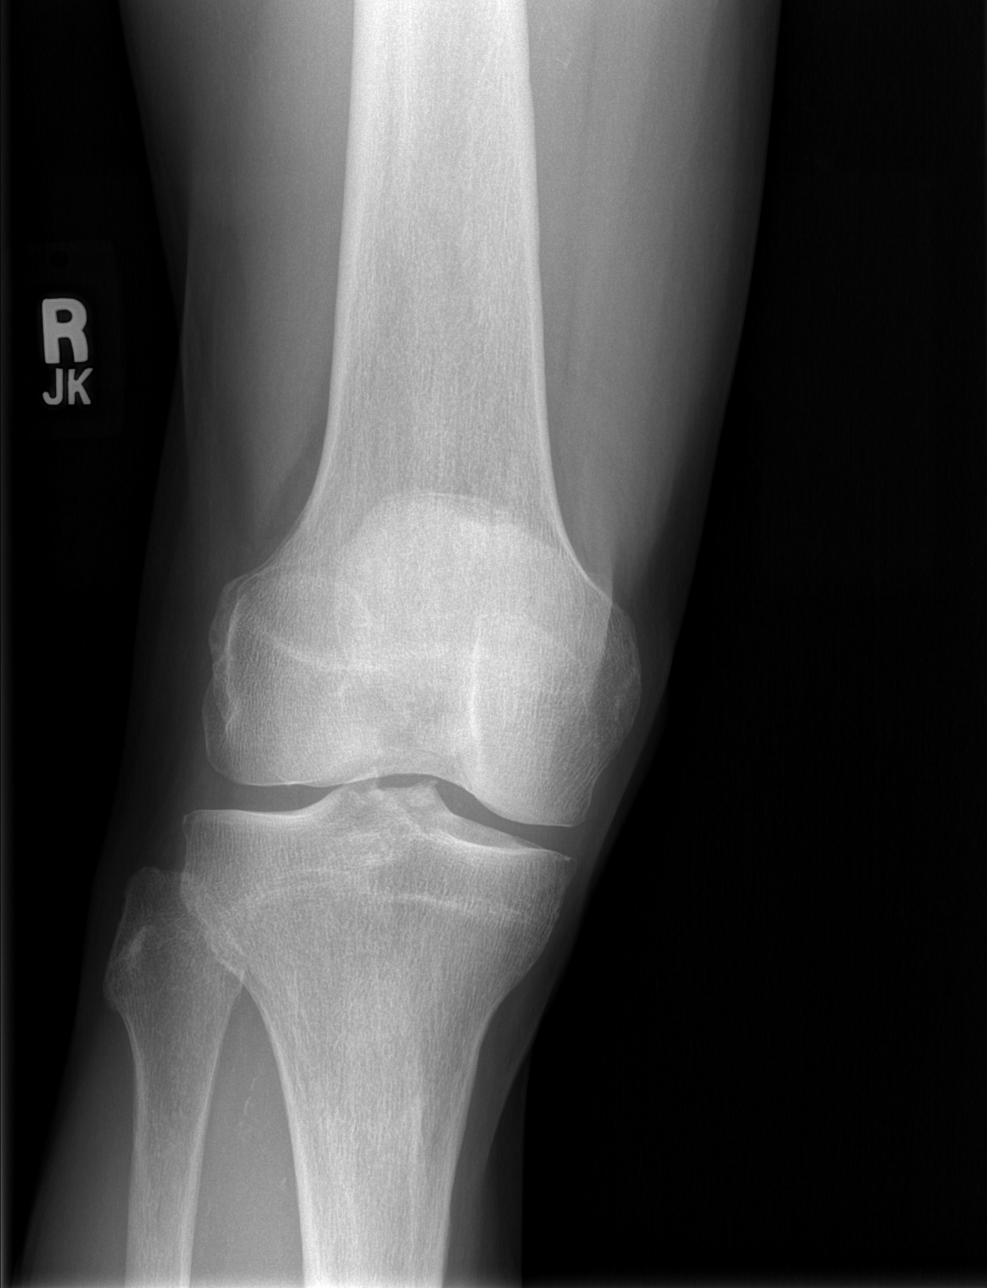

[t knee lat right]
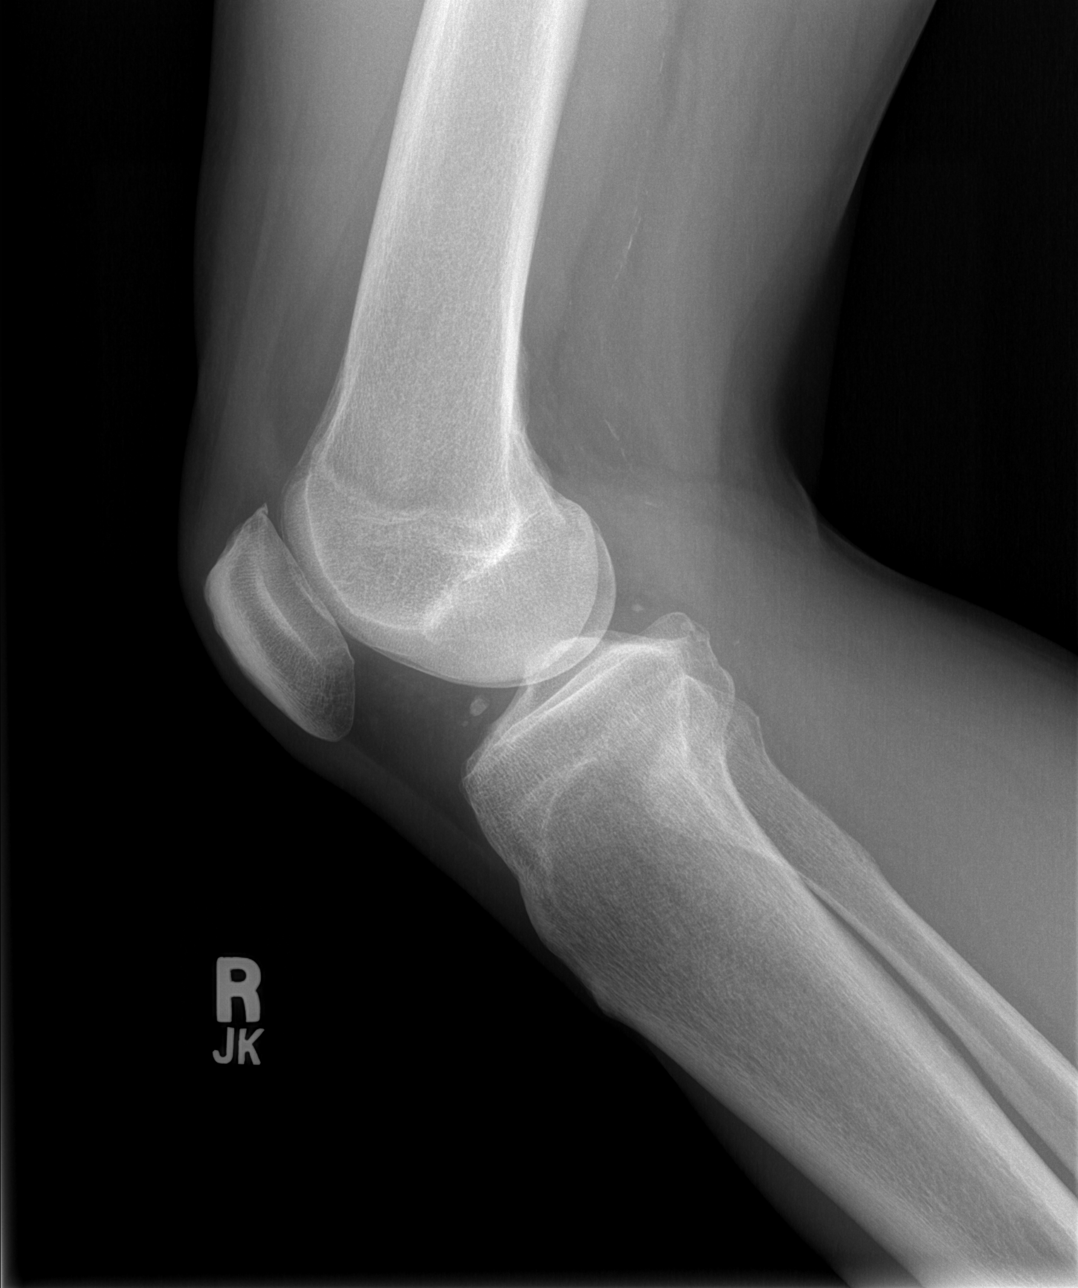

[4 of 4 positions shown; findings below may reference images not displayed]

FINDINGS: No fracture or dislocation. No significant tibiofemoral joint space
narrowing. Minimal spurring posterior patella suggestive of minimal
patellofemoral joint degenerative changes. Tiny suprapatellar joint
fluid. Vascular calcifications.
IMPRESSION: 1. Minimal patellofemoral joint degenerative changes.
2. Tiny suprapatellar joint fluid.
3. No significant tibiofemoral joint space narrowing.

## 2018-10-16 IMAGING — CR DG HIP (WITH OR WITHOUT PELVIS) 2-3V*R*
2 series · 2 of 2 positions shown · non-contrast
Comparison: None.

CLINICAL DATA: 62-year-old male with chronic right lateral hip pain
extending to knee for the past 5 months. No known injury. Initial
encounter.

EXAM:
DG HIP (WITH OR WITHOUT PELVIS) 2-3V RIGHT

[t hip ap right]
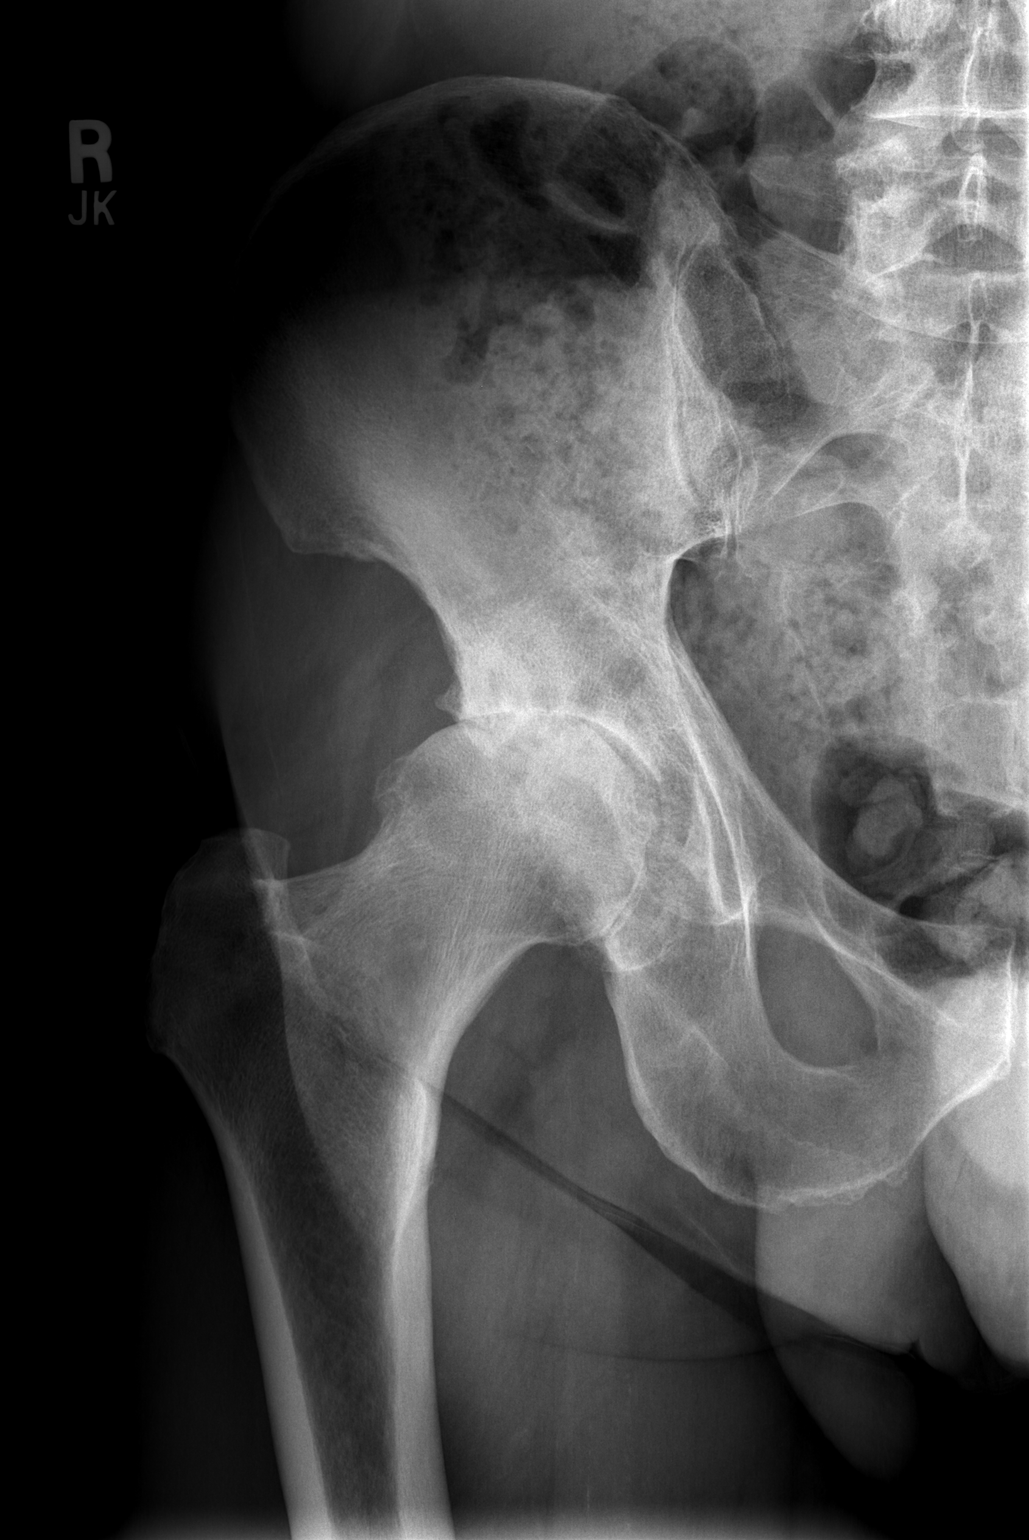

[t hip frog leg right]
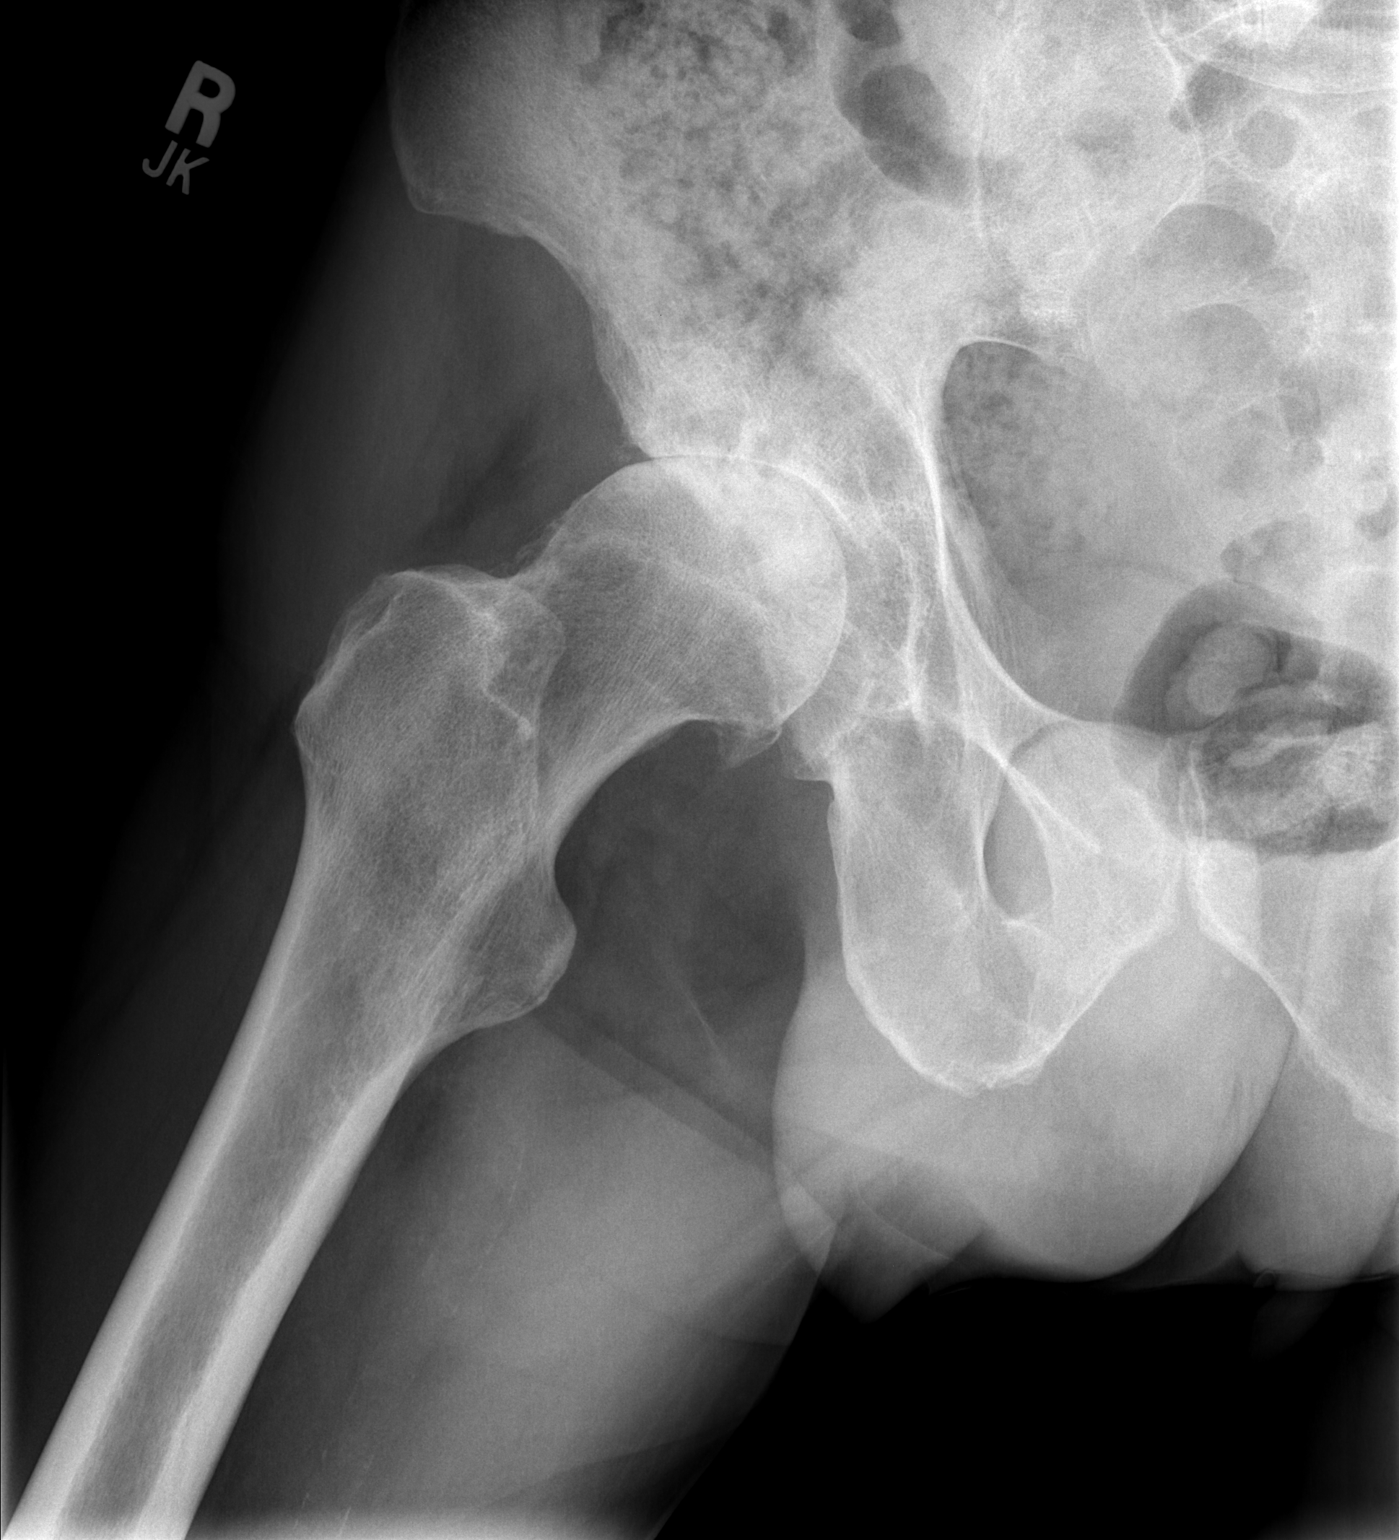

[2 of 2 positions shown; findings below may reference images not displayed]

FINDINGS: Marked right hip joint degenerative changes with loss of joint space
and mild flattening of the superior aspect of the right femoral head
without collapse. Subchondral sclerosis and cysts. Surrounding
osteophyte.

No acute fracture or dislocation.

Right sacroiliac joint appears intact.
IMPRESSION: Marked right hip joint degenerative changes as detailed above.

## 2019-02-08 ENCOUNTER — Ambulatory Visit: Payer: Self-pay

## 2019-02-08 ENCOUNTER — Ambulatory Visit (INDEPENDENT_AMBULATORY_CARE_PROVIDER_SITE_OTHER): Payer: 59 | Admitting: Orthopaedic Surgery

## 2019-02-08 ENCOUNTER — Encounter: Payer: Self-pay | Admitting: Orthopaedic Surgery

## 2019-02-08 ENCOUNTER — Other Ambulatory Visit: Payer: Self-pay

## 2019-02-08 DIAGNOSIS — Z96641 Presence of right artificial hip joint: Secondary | ICD-10-CM

## 2019-02-08 DIAGNOSIS — M25561 Pain in right knee: Secondary | ICD-10-CM

## 2019-02-08 MED ORDER — LIDOCAINE HCL 1 % IJ SOLN
3.0000 mL | INTRAMUSCULAR | Status: AC | PRN
  Administered 2019-02-08: 3 mL

## 2019-02-08 MED ORDER — METHYLPREDNISOLONE ACETATE 40 MG/ML IJ SUSP
40.0000 mg | INTRAMUSCULAR | Status: AC | PRN
  Administered 2019-02-08: 40 mg via INTRA_ARTICULAR

## 2019-02-08 NOTE — Progress Notes (Signed)
Office Visit Note   Patient: Willie Hernandez           Date of Birth: [birth year]           MRN: [id number] Visit Date: 02/08/2019              Requested by: Lavone Orn, MD [address] [place],  [place] [address] PCP: Lavone Orn, MD   Assessment & Plan: Visit Diagnoses:  1. History of right hip replacement   2. Acute pain of right knee     Plan: We will see him back on an as-needed basis in regards to the right hip.  As for his right knee if he continues to have pain despite the cortisone injection would recommend MRI to rule out internal derangement.  He can call the office to have a MRI set up if needed.   Follow-Up Instructions: Return if symptoms worsen or fail to improve.   Orders:  Orders Placed This Encounter  Procedures   Large Joint Inj   XR HIP UNILAT W OR W/O PELVIS 1V RIGHT   No orders of the defined types were placed in this encounter.     Procedures: Large Joint Inj on 02/08/2019 5:01 PM Indications: pain Details: 22 G 1.5 in needle, anterolateral approach  Arthrogram: No  Medications: 3 mL lidocaine 1 %; 40 mg methylPREDNISolone acetate 40 MG/ML Outcome: tolerated well, no immediate complications Procedure, treatment alternatives, risks and benefits explained, specific risks discussed. Consent was given by the patient. Immediately prior to procedure a time out was called to verify the correct patient, procedure, equipment, support staff and site/side marked as required. Patient was prepped and draped in the usual sterile fashion.       Clinical Data: No additional findings.   Subjective: Chief Complaint  Patient presents with   Right Hip - Follow-up    HPI Mr. Perfect returns today status post right total hip arthroplasty 06/30/2018.  States overall that his right hip is doing well.  He has been feeling really well and is been doing a lot of cycling he was doing some cycling on a trainer and doing a lot of mountain  setting developed right knee pain.  He denies any true mechanical symptoms of the knee.  States that overall he is getting better but still bothers him most his pain is anterior aspect of the knee.  Review of Systems  Constitutional: Negative for chills and fever.     Objective: Vital Signs: There were no vitals taken for this visit.  Physical Exam Constitutional:      Appearance: He is not ill-appearing or diaphoretic.  Pulmonary:     Effort: Pulmonary effort is normal.  Neurological:     Mental Status: He is alert and oriented to person, place, and time.     Ortho Exam Right hip excellent range of motion without pain.  Right knee no tenderness along medial lateral joint line.  No instability valgus stressing.  Good range of motion.  McMurray's is negative.  Very slight effusion. No erythema or ecchymosis.  Specialty Comments:  No specialty comments available.  Imaging: Xr Hip Unilat W Or W/o Pelvis 1v Right  Result Date: 02/08/2019 AP pelvis and lateral right hip: Well seated right hip arthroplasty components. No acute fracture.     PMFS History: Patient Active Problem List   Diagnosis Date Noted   Status post total replacement of right hip 06/30/2018   Unilateral primary osteoarthritis, right hip 05/08/2018  Hx of adenomatous colonic polyps 07/14/2017   Rhegmatogenous retinal detachment of left eye 09/07/2016   Multiple retinal breaks of right eye 09/07/2016   GERD (gastroesophageal reflux disease) 02/20/2013   Past Medical History:  Diagnosis Date   Allergic rhinitis    GERD (gastroesophageal reflux disease)    Hallux rigidus    with heel cord tightness, Dr. Sharol Given   Hearing loss in right ear    noise exposure   Hx of adenomatous colonic polyps 07/14/2017   Mitral valve prolapse    mild mitral regurgitation   PONV (postoperative nausea and vomiting)    from Versed   Rhegmatogenous retinal detachment of left eye    Spermatocele    Right, Dr.  Risa Grill    Family History  Problem Relation Age of Onset   Atrial fibrillation Father    Sudden death Father    Congestive Heart Failure Mother    Alzheimer's disease Mother    Esophageal cancer Unknown        uncle   Colon cancer Paternal Grandfather    Colon polyps Neg Hx    Rectal cancer Neg Hx    Stomach cancer Neg Hx     Past Surgical History:  Procedure Laterality Date   COLONOSCOPY  1997, 2006   corrective jaw surgery  2001   for overbite   eye lens replaced  05/27/2018   at [facility]   GAS INSERTION Left 09/07/2016   Procedure: INSERTION OF [id number];  Surgeon: Hayden Pedro, MD;  Location: [facility];  Service: Ophthalmology;  Laterality: Left;   LASER PHOTO ABLATION Bilateral 09/07/2016   Procedure: LASER PHOTO ABLATION RIGHT EYE;  Surgeon: Hayden Pedro, MD;  Location: [facility];  Service: Ophthalmology;  Laterality: Bilateral;   laser retinal tears OS  04/2012   SCLERAL BUCKLE WITH CRYO Left 09/07/2016   Procedure: SCLERAL BUCKLE WITH CRYO;  Surgeon: Hayden Pedro, MD;  Location: [facility];  Service: Ophthalmology;  Laterality: Left;   TOTAL HIP ARTHROPLASTY Right 06/30/2018   Procedure: RIGHT TOTAL HIP ARTHROPLASTY ANTERIOR APPROACH;  Surgeon: Mcarthur Rossetti, MD;  Location: [facility];  Service: Orthopedics;  Laterality: Right;   UPPER GASTROINTESTINAL ENDOSCOPY     WISDOM TOOTH EXTRACTION     age 77   Social History   Occupational History   Occupation: Forensic psychologist for [employer].    Employer: [employer]   Tobacco Use   Smoking status: Never Smoker   Smokeless tobacco: Never Used  Substance and Sexual Activity   Alcohol use: Yes    Comment: occasional   Drug use: No   Sexual activity: Not on file

## 2019-02-12 ENCOUNTER — Other Ambulatory Visit (INDEPENDENT_AMBULATORY_CARE_PROVIDER_SITE_OTHER): Payer: Self-pay | Admitting: Orthopaedic Surgery

## 2019-02-12 NOTE — Telephone Encounter (Signed)
Please advise 

## 2019-04-10 ENCOUNTER — Other Ambulatory Visit (INDEPENDENT_AMBULATORY_CARE_PROVIDER_SITE_OTHER): Payer: Self-pay | Admitting: Orthopaedic Surgery

## 2019-07-23 ENCOUNTER — Other Ambulatory Visit: Payer: Self-pay

## 2019-07-23 MED ORDER — NABUMETONE 750 MG PO TABS: 750.0000 mg | ORAL_TABLET | Freq: Two times a day (BID) | ORAL | 1 refills | Status: DC | PRN

## 2019-09-03 ENCOUNTER — Ambulatory Visit: Payer: 59 | Attending: Internal Medicine

## 2019-09-03 DIAGNOSIS — Z23 Encounter for immunization: Secondary | ICD-10-CM

## 2019-09-03 NOTE — Progress Notes (Signed)
   Covid-19 Vaccination Clinic  Name:  Willie Hernandez    MRN: [id number] DOB: May 27, 1956  09/03/2019  [name] was observed post Covid-19 immunization for 15 minutes without incidence. He was provided with Vaccine Information Sheet and instruction to access the V-Safe system.   [name] was instructed to call 911 with any severe reactions post vaccine: Marland Kitchen Difficulty breathing  . Swelling of your face and throat  . A fast heartbeat  . A bad rash all over your body  . Dizziness and weakness    Immunizations Administered    Name Date Dose VIS Date Route   Pfizer COVID-19 Vaccine 09/03/2019  1:50 PM 0.3 mL 07/06/2019 Intramuscular   Manufacturer: [place]   Lot: [id number]   [place]: [id number]

## 2019-09-20 ENCOUNTER — Ambulatory Visit: Payer: 59

## 2019-09-21 ENCOUNTER — Other Ambulatory Visit: Payer: Self-pay | Admitting: Orthopaedic Surgery

## 2019-09-28 ENCOUNTER — Ambulatory Visit: Payer: 59 | Attending: Internal Medicine

## 2019-09-28 DIAGNOSIS — Z23 Encounter for immunization: Secondary | ICD-10-CM | POA: Insufficient documentation

## 2019-09-28 NOTE — Progress Notes (Signed)
   Covid-19 Vaccination Clinic  Name:  FON RIJO    MRN: [id number] DOB: 1956/03/19  09/28/2019  Mr. Mcnemar was observed post Covid-19 immunization for 15 minutes without incident. He was provided with Vaccine Information Sheet and instruction to access the V-Safe system.   Mr. Gadbois was instructed to call 911 with any severe reactions post vaccine: Marland Kitchen Difficulty breathing  . Swelling of face and throat  . A fast heartbeat  . A bad rash all over body  . Dizziness and weakness   Immunizations Administered    Name Date Dose VIS Date Route   Pfizer COVID-19 Vaccine 09/28/2019  9:41 AM 0.3 mL 07/06/2019 Intramuscular   Manufacturer: [place]   Lot: [id number]   [place]: [id number]

## 2019-12-03 ENCOUNTER — Other Ambulatory Visit: Payer: Self-pay | Admitting: Orthopaedic Surgery

## 2020-07-29 ENCOUNTER — Other Ambulatory Visit: Payer: Self-pay | Admitting: Orthopaedic Surgery

## 2020-09-18 ENCOUNTER — Other Ambulatory Visit: Payer: Self-pay | Admitting: Orthopaedic Surgery

## 2021-03-27 ENCOUNTER — Telehealth: Payer: Self-pay | Admitting: Orthopaedic Surgery
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
## 2021-03-31 ENCOUNTER — Telehealth: Payer: Self-pay | Admitting: Orthopaedic Surgery

## 2021-03-31 DIAGNOSIS — S32119A Unspecified Zone I fracture of sacrum, initial encounter for closed fracture: Secondary | ICD-10-CM | POA: Insufficient documentation

## 2021-03-31 DIAGNOSIS — S42022A Displaced fracture of shaft of left clavicle, initial encounter for closed fracture: Secondary | ICD-10-CM

## 2021-03-31 DIAGNOSIS — S61219A Laceration without foreign body of unspecified finger without damage to nail, initial encounter: Secondary | ICD-10-CM | POA: Insufficient documentation

## 2021-03-31 DIAGNOSIS — S2242XA Multiple fractures of ribs, left side, initial encounter for closed fracture: Secondary | ICD-10-CM | POA: Insufficient documentation

## 2021-03-31 DIAGNOSIS — J939 Pneumothorax, unspecified: Secondary | ICD-10-CM | POA: Insufficient documentation

## 2021-03-31 DIAGNOSIS — S27321A Contusion of lung, unilateral, initial encounter: Secondary | ICD-10-CM

## 2021-03-31 HISTORY — DX: Unspecified zone i fracture of sacrum, initial encounter for closed fracture: S32.119A

## 2021-03-31 HISTORY — DX: Multiple fractures of ribs, left side, initial encounter for closed fracture: S22.42XA

## 2021-03-31 HISTORY — DX: Pneumothorax, unspecified: J93.9

## 2021-03-31 HISTORY — DX: Displaced fracture of shaft of left clavicle, initial encounter for closed fracture: S42.022A

## 2021-03-31 HISTORY — DX: Contusion of lung, unilateral, initial encounter: S27.321A

## 2021-03-31 NOTE — Telephone Encounter (Signed)
Called and left 1X on vm for wife of pt to call and set appt with Dr. Ninfa Linden when husband is released from hospital.
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
## 2021-04-02 ENCOUNTER — Encounter (HOSPITAL_COMMUNITY): Payer: Self-pay | Admitting: Physical Medicine and Rehabilitation

## 2021-04-02 ENCOUNTER — Other Ambulatory Visit: Payer: Self-pay

## 2021-04-02 ENCOUNTER — Inpatient Hospital Stay (HOSPITAL_COMMUNITY)
Admission: RE | Admit: 2021-04-02 | Discharge: 2021-04-10 | DRG: 560 | Disposition: A | Discharge status: Home Health | Payer: 59 | Source: Other Acute Inpatient Hospital | Attending: Physical Medicine and Rehabilitation | Admitting: Physical Medicine and Rehabilitation

## 2021-04-02 DIAGNOSIS — Z881 Allergy status to other antibiotic agents status: Secondary | ICD-10-CM

## 2021-04-02 DIAGNOSIS — Z7982 Long term (current) use of aspirin: Secondary | ICD-10-CM | POA: Diagnosis not present

## 2021-04-02 DIAGNOSIS — S32592G Other specified fracture of left pubis, subsequent encounter for fracture with delayed healing: Secondary | ICD-10-CM

## 2021-04-02 DIAGNOSIS — Z882 Allergy status to sulfonamides status: Secondary | ICD-10-CM | POA: Diagnosis not present

## 2021-04-02 DIAGNOSIS — D62 Acute posthemorrhagic anemia: Secondary | ICD-10-CM | POA: Diagnosis present

## 2021-04-02 DIAGNOSIS — Z8249 Family history of ischemic heart disease and other diseases of the circulatory system: Secondary | ICD-10-CM | POA: Diagnosis not present

## 2021-04-02 DIAGNOSIS — S42002D Fracture of unspecified part of left clavicle, subsequent encounter for fracture with routine healing: Secondary | ICD-10-CM

## 2021-04-02 DIAGNOSIS — I82811 Embolism and thrombosis of superficial veins of right lower extremities: Secondary | ICD-10-CM | POA: Diagnosis present

## 2021-04-02 DIAGNOSIS — T07XXXA Unspecified multiple injuries, initial encounter: Secondary | ICD-10-CM

## 2021-04-02 DIAGNOSIS — K5903 Drug induced constipation: Secondary | ICD-10-CM

## 2021-04-02 DIAGNOSIS — S271XXD Traumatic hemothorax, subsequent encounter: Secondary | ICD-10-CM | POA: Diagnosis not present

## 2021-04-02 DIAGNOSIS — Z800 Family history of malignant neoplasm of digestive organs: Secondary | ICD-10-CM

## 2021-04-02 DIAGNOSIS — S32592D Other specified fracture of left pubis, subsequent encounter for fracture with routine healing: Secondary | ICD-10-CM | POA: Diagnosis not present

## 2021-04-02 DIAGNOSIS — S61412A Laceration without foreign body of left hand, initial encounter: Secondary | ICD-10-CM | POA: Diagnosis present

## 2021-04-02 DIAGNOSIS — T1490XA Injury, unspecified, initial encounter: Secondary | ICD-10-CM | POA: Diagnosis present

## 2021-04-02 DIAGNOSIS — Z820 Family history of epilepsy and other diseases of the nervous system: Secondary | ICD-10-CM | POA: Diagnosis not present

## 2021-04-02 DIAGNOSIS — Z79899 Other long term (current) drug therapy: Secondary | ICD-10-CM

## 2021-04-02 DIAGNOSIS — S27321D Contusion of lung, unilateral, subsequent encounter: Secondary | ICD-10-CM

## 2021-04-02 DIAGNOSIS — S3210XD Unspecified fracture of sacrum, subsequent encounter for fracture with routine healing: Secondary | ICD-10-CM | POA: Diagnosis present

## 2021-04-02 DIAGNOSIS — Z96641 Presence of right artificial hip joint: Secondary | ICD-10-CM | POA: Diagnosis present

## 2021-04-02 DIAGNOSIS — S81802D Unspecified open wound, left lower leg, subsequent encounter: Secondary | ICD-10-CM

## 2021-04-02 DIAGNOSIS — S2243XA Multiple fractures of ribs, bilateral, initial encounter for closed fracture: Secondary | ICD-10-CM | POA: Diagnosis present

## 2021-04-02 DIAGNOSIS — Z23 Encounter for immunization: Secondary | ICD-10-CM

## 2021-04-02 DIAGNOSIS — S41102D Unspecified open wound of left upper arm, subsequent encounter: Secondary | ICD-10-CM | POA: Diagnosis not present

## 2021-04-02 HISTORY — DX: Other specified fracture of left pubis, subsequent encounter for fracture with delayed healing: S32.592G

## 2021-04-02 MED ORDER — PROCHLORPERAZINE MALEATE 5 MG PO TABS: 5.0000 mg | ORAL_TABLET | Freq: Four times a day (QID) | ORAL | Status: DC | PRN

## 2021-04-02 MED ORDER — ALUM & MAG HYDROXIDE-SIMETH 200-200-20 MG/5ML PO SUSP: 30.0000 mL | ORAL | Status: DC | PRN

## 2021-04-02 MED ORDER — OXYCODONE HCL 5 MG PO TABS
5.0000 mg | ORAL_TABLET | Freq: Two times a day (BID) | ORAL | Status: DC
  Administered 2021-04-03 – 2021-04-10 (×16): 5 mg via ORAL
  Filled 2021-04-02 (×16): qty 1

## 2021-04-02 MED ORDER — GABAPENTIN 300 MG PO CAPS
300.0000 mg | ORAL_CAPSULE | Freq: Three times a day (TID) | ORAL | Status: DC
  Administered 2021-04-02 – 2021-04-10 (×24): 300 mg via ORAL
  Filled 2021-04-02 (×24): qty 1

## 2021-04-02 MED ORDER — TROLAMINE SALICYLATE 10 % EX CREA
TOPICAL_CREAM | Freq: Two times a day (BID) | CUTANEOUS | Status: DC | PRN
  Filled 2021-04-02: qty 85

## 2021-04-02 MED ORDER — SENNOSIDES-DOCUSATE SODIUM 8.6-50 MG PO TABS
2.0000 | ORAL_TABLET | Freq: Every day | ORAL | Status: DC
  Administered 2021-04-02 – 2021-04-09 (×8): 2 via ORAL
  Filled 2021-04-02 (×8): qty 2

## 2021-04-02 MED ORDER — RIVAROXABAN 10 MG PO TABS
10.0000 mg | ORAL_TABLET | Freq: Every day | ORAL | Status: DC
  Administered 2021-04-03 – 2021-04-10 (×8): 10 mg via ORAL
  Filled 2021-04-02 (×8): qty 1

## 2021-04-02 MED ORDER — METHOCARBAMOL 500 MG PO TABS
500.0000 mg | ORAL_TABLET | Freq: Four times a day (QID) | ORAL | Status: DC | PRN
  Administered 2021-04-03 – 2021-04-09 (×6): 500 mg via ORAL
  Filled 2021-04-02 (×6): qty 1

## 2021-04-02 MED ORDER — TRAZODONE HCL 50 MG PO TABS
25.0000 mg | ORAL_TABLET | Freq: Every evening | ORAL | Status: DC | PRN
  Administered 2021-04-07 – 2021-04-08 (×2): 50 mg via ORAL
  Filled 2021-04-02 (×3): qty 1

## 2021-04-02 MED ORDER — SENNOSIDES-DOCUSATE SODIUM 8.6-50 MG PO TABS: 2.0000 | ORAL_TABLET | Freq: Two times a day (BID) | ORAL | Status: DC

## 2021-04-02 MED ORDER — PROCHLORPERAZINE 25 MG RE SUPP
12.5000 mg | Freq: Four times a day (QID) | RECTAL | Status: DC | PRN
Start: 2021-04-02 — End: 2021-04-10

## 2021-04-02 MED ORDER — PROCHLORPERAZINE EDISYLATE 10 MG/2ML IJ SOLN: 5.0000 mg | Freq: Four times a day (QID) | INTRAMUSCULAR | Status: DC | PRN

## 2021-04-02 MED ORDER — OXYCODONE HCL 5 MG PO TABS
5.0000 mg | ORAL_TABLET | ORAL | Status: DC | PRN
  Administered 2021-04-03 – 2021-04-05 (×3): 5 mg via ORAL
  Filled 2021-04-02 (×3): qty 1

## 2021-04-02 MED ORDER — GUAIFENESIN-DM 100-10 MG/5ML PO SYRP: 5.0000 mL | ORAL_SOLUTION | Freq: Four times a day (QID) | ORAL | Status: DC | PRN

## 2021-04-02 MED ORDER — LIDOCAINE 5 % EX PTCH
3.0000 | MEDICATED_PATCH | CUTANEOUS | Status: DC
  Filled 2021-04-02: qty 3

## 2021-04-02 MED ORDER — MUSCLE RUB 10-15 % EX CREA
TOPICAL_CREAM | Freq: Two times a day (BID) | CUTANEOUS | Status: DC | PRN
  Filled 2021-04-02: qty 85

## 2021-04-02 MED ORDER — BISACODYL 10 MG RE SUPP: 10.0000 mg | Freq: Every day | RECTAL | Status: DC | PRN

## 2021-04-02 MED ORDER — POLYETHYLENE GLYCOL 3350 17 G PO PACK: 17.0000 g | PACK | Freq: Every day | ORAL | Status: DC | PRN

## 2021-04-02 MED ORDER — DIPHENHYDRAMINE HCL 12.5 MG/5ML PO ELIX: 12.5000 mg | ORAL_SOLUTION | Freq: Four times a day (QID) | ORAL | Status: DC | PRN

## 2021-04-02 MED ORDER — FLEET ENEMA 7-19 GM/118ML RE ENEM: 1.0000 | ENEMA | Freq: Once | RECTAL | Status: DC | PRN

## 2021-04-02 MED ORDER — ACETAMINOPHEN 325 MG PO TABS
325.0000 mg | ORAL_TABLET | ORAL | Status: DC | PRN
Start: 2021-04-02 — End: 2021-04-10

## 2021-04-02 MED ORDER — SENNOSIDES-DOCUSATE SODIUM 8.6-50 MG PO TABS: 2.0000 | ORAL_TABLET | Freq: Every day | ORAL | Status: DC | PRN

## 2021-04-02 NOTE — Progress Notes (Signed)
Patient arrived via ambulance around 1400, accompanied by his wife. Patient appears alert and oriented and makes no complaint of pain.
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
## 2021-04-02 NOTE — Progress Notes (Signed)
Inpatient Rehabilitation Medication Review by a Pharmacist  A complete drug regimen review was completed for this patient to identify any potential clinically significant medication issues.  High Risk Drug Classes Is patient taking? Indication by Medication  Antipsychotic Yes Compazine for nausea  Anticoagulant Yes Xarelto for VTE px  Antibiotic No   Opioid Yes oxyIR for pain  Antiplatelet No   Hypoglycemics/insulin No   Vasoactive Medication No   Chemotherapy No   Other No      Type of Medication Issue Identified Description of Issue Recommendation(s)  Drug Interaction(s) (clinically significant)     Duplicate Therapy     Allergy     No Medication Administration End Date     Incorrect Dose     Additional Drug Therapy Needed  Patient discharged on diazepam PRN for anxiety, spasms Please evaluate in AM for continuation of diazepam for anxiety  Significant med changes from prior encounter (inform family/care partners about these prior to discharge). Patient on Vit D, pepcid, allegra,Vit C and flonase PTA at home.  Please evaluate for continuation of home meds if appropriate   Other       Clinically significant medication issues were identified that warrant physician communication and completion of prescribed/recommended actions by midnight of the next day:  No  Name of provider notified for urgent issues identified:   Provider Method of Notification:     Pharmacist comments: Discharge summary reviewed on floor as well as care everywhere.   Time spent performing this drug regimen review (minutes):  20   Sacha Radloff A. Levada Dy, PharmD, BCPS, FNKF Clinical Pharmacist [place] Please utilize Amion for appropriate phone number to reach the unit pharmacist ([place])  04/02/2021 4:08 PM

## 2021-04-03 ENCOUNTER — Inpatient Hospital Stay (HOSPITAL_COMMUNITY): Payer: 59

## 2021-04-03 DIAGNOSIS — S32592G Other specified fracture of left pubis, subsequent encounter for fracture with delayed healing: Secondary | ICD-10-CM | POA: Diagnosis not present

## 2021-04-03 DIAGNOSIS — T1490XA Injury, unspecified, initial encounter: Secondary | ICD-10-CM | POA: Diagnosis not present

## 2021-04-03 LAB — CBC WITH DIFFERENTIAL/PLATELET
Abs Immature Granulocytes: 0.01 10*3/uL (ref 0.00–0.07)
Basophils Absolute: 0 10*3/uL (ref 0.0–0.1)
Basophils Relative: 1 %
Eosinophils Absolute: 0.2 10*3/uL (ref 0.0–0.5)
Eosinophils Relative: 3 %
HCT: 31.8 % — ABNORMAL LOW (ref 39.0–52.0)
Hemoglobin: 10.6 g/dL — ABNORMAL LOW (ref 13.0–17.0)
Immature Granulocytes: 0 %
Lymphocytes Relative: 23 %
Lymphs Abs: 1.2 10*3/uL (ref 0.7–4.0)
MCH: 31.6 pg (ref 26.0–34.0)
MCHC: 33.3 g/dL (ref 30.0–36.0)
MCV: 94.9 fL (ref 80.0–100.0)
Monocytes Absolute: 0.8 10*3/uL (ref 0.1–1.0)
Monocytes Relative: 15 %
Neutro Abs: 3 10*3/uL (ref 1.7–7.7)
Neutrophils Relative %: 58 %
Platelets: 263 10*3/uL (ref 150–400)
RBC: 3.35 MIL/uL — ABNORMAL LOW (ref 4.22–5.81)
RDW: 13.2 % (ref 11.5–15.5)
WBC: 5.2 10*3/uL (ref 4.0–10.5)
nRBC: 0 % (ref 0.0–0.2)

## 2021-04-03 LAB — COMPREHENSIVE METABOLIC PANEL WITH GFR
ALT: 63 U/L — ABNORMAL HIGH (ref 0–44)
AST: 60 U/L — ABNORMAL HIGH (ref 15–41)
Albumin: 2.7 g/dL — ABNORMAL LOW (ref 3.5–5.0)
Alkaline Phosphatase: 59 U/L (ref 38–126)
Anion gap: 4 — ABNORMAL LOW (ref 5–15)
BUN: 10 mg/dL (ref 8–23)
CO2: 28 mmol/L (ref 22–32)
Calcium: 8.7 mg/dL — ABNORMAL LOW (ref 8.9–10.3)
Chloride: 104 mmol/L (ref 98–111)
Creatinine, Ser: 0.58 mg/dL — ABNORMAL LOW (ref 0.61–1.24)
GFR, Estimated: >60 mL/min
Glucose, Bld: 95 mg/dL (ref 70–99)
Potassium: 4.5 mmol/L (ref 3.5–5.1)
Sodium: 136 mmol/L (ref 135–145)
Total Bilirubin: 0.6 mg/dL (ref 0.3–1.2)
Total Protein: 5.1 g/dL — ABNORMAL LOW (ref 6.5–8.1)

## 2021-04-03 MED ORDER — LIDOCAINE 5 % EX PTCH
3.0000 | MEDICATED_PATCH | CUTANEOUS | Status: DC
  Administered 2021-04-03 – 2021-04-09 (×7): 3 via TRANSDERMAL
  Filled 2021-04-03 (×7): qty 3
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
## 2021-04-03 NOTE — Progress Notes (Signed)
Occupational Therapy Session Note  Patient Details  Name: Willie Hernandez MRN: [id number] Date of Birth: August 21, 1955  Today's Date: 04/04/2021 OT Individual Time: 1001-1059 and 1400-1428 OT Individual Time Calculation (min): 58 min and 28 min  Short Term Goals: Week 1:  OT Short Term Goal 1 (Week 1): STGs=LTGs due to ELOS  Skilled Therapeutic Interventions/Progress Updates:    Pt greeted in the recliner, agreeable to session and wanting to start by brushing his teeth at the sink. CGA for short distance ambulatory transfer to the w/c placed in front of sink using PFRW. Vcs for maintaining Lt LE TDWB status during transfer. He then completed oral care/grooming tasks while seated with setup assistance. Afterwards, pt was escorted down to the ADL apartment and we practiced simulated walk-in shower transfers. Pt reports that he already has a shower seat + BSC at home to use post d/c. CGA and vcs for transfers using PFRW. Advised purchasing nonslip treads for the shower floor and also wearing nonslip footwear in/out of the shower to maximize Rt foot traction. Pt was then escorted back to the room via w/c. Short distance ambulatory transfer completed using PFRW with CGA. He remained sitting in the recliner at close of session, all needs within reach. Tx focus placed on d/c planning and pt education.  2nd Session 1:1 tx (28 min) Pt greeted in the recliner and agreeable to session. OT printed him out a handout regarding seated and standing pendulum exercises, reviewed together with education emphasis placed on how to adhere to WB precautions during completion with the Lt UE. Advised pt to have his spouse present at home whenever doing standing pendulums (reports they have supportive kitchen counters he can use at home). Also reviewed active/active assist shoulder flexion and active shoulder abduction to 90 degrees per MD order. Discussed making Lt UE exercises part of his daily routine to prevent ROM  limitations and swelling. Pt remained sitting up at close of session, all needs within reach and chair alarm set. Tx focus placed on d/c planning and pt education.  Therapy Documentation Precautions:  Precautions Precautions: Fall Required Braces or Orthoses: Sling (for comfort) Restrictions Weight Bearing Restrictions: Yes LUE Weight Bearing: Partial weight bearing LUE Partial Weight Bearing Percentage or Pounds: with sling for comfort, per MD order: Left shoulder pendulums and advance as tolerated up to Forward Flexion and Abduction to 90 degrees LLE Weight Bearing: Touchdown weight bearing  Pain: in Lt hip. Per pt, premedicated by RN. Rest breaks and repositioning provided throughout sessions for pain mgt   ADL: ADL Eating: Independent Where Assessed-Eating: Edge of bed Grooming: Setup Where Assessed-Grooming: Edge of bed Upper Body Bathing: Supervision/safety Where Assessed-Upper Body Bathing: Edge of bed Lower Body Bathing: Contact guard Where Assessed-Lower Body Bathing: Edge of bed Upper Body Dressing: Not assessed Lower Body Dressing: Contact guard Where Assessed-Lower Body Dressing: Edge of bed Toileting: Not assessed Toilet Transfer: Not assessed Tub/Shower Transfer: Not assessed      Therapy/Group: Individual Therapy  Brigitta Pricer A Lorea Kupfer 04/04/2021, 12:18 PM

## 2021-04-03 NOTE — Progress Notes (Signed)
Inpatient Rehabilitation  Patient information reviewed and entered into eRehab system by Trecia Maring Cia Garretson, OTR/L.   Information including medical coding, functional ability and quality indicators will be reviewed and updated through discharge.    
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
## 2021-04-03 NOTE — Plan of Care (Signed)
  Problem: RH Balance Goal: LTG Patient will maintain dynamic standing with ADLs (OT) Description: LTG:  Patient will maintain dynamic standing balance with assist during activities of daily living (OT)  Flowsheets (Taken 04/03/2021 1213) LTG: Pt will maintain dynamic standing balance during ADLs with: Independent with assistive device   Problem: RH Grooming Goal: LTG Patient will perform grooming w/assist,cues/equip (OT) Description: LTG: Patient will perform grooming with assist, with/without cues using equipment (OT) Flowsheets (Taken 04/03/2021 1213) LTG: Pt will perform grooming with assistance level of: Independent with assistive device    Problem: RH Bathing Goal: LTG Patient will bathe all body parts with assist levels (OT) Description: LTG: Patient will bathe all body parts with assist levels (OT) Flowsheets (Taken 04/03/2021 1213) LTG: Pt will perform bathing with assistance level/cueing: Set up assist    Problem: RH Dressing Goal: LTG Patient will perform upper body dressing (OT) Description: LTG Patient will perform upper body dressing with assist, with/without cues (OT). Flowsheets (Taken 04/03/2021 1213) LTG: Pt will perform upper body dressing with assistance level of: Independent with assistive device Goal: LTG Patient will perform lower body dressing w/assist (OT) Description: LTG: Patient will perform lower body dressing with assist, with/without cues in positioning using equipment (OT) Flowsheets (Taken 04/03/2021 1213) LTG: Pt will perform lower body dressing with assistance level of: Independent with assistive device   Problem: RH Toileting Goal: LTG Patient will perform toileting task (3/3 steps) with assistance level (OT) Description: LTG: Patient will perform toileting task (3/3 steps) with assistance level (OT)  Flowsheets (Taken 04/03/2021 1213) LTG: Pt will perform toileting task (3/3 steps) with assistance level: Independent with assistive device   Problem: RH Toilet  Transfers Goal: LTG Patient will perform toilet transfers w/assist (OT) Description: LTG: Patient will perform toilet transfers with assist, with/without cues using equipment (OT) Flowsheets (Taken 04/03/2021 1213) LTG: Pt will perform toilet transfers with assistance level of: Independent with assistive device   Problem: RH Tub/Shower Transfers Goal: LTG Patient will perform tub/shower transfers w/assist (OT) Description: LTG: Patient will perform tub/shower transfers with assist, with/without cues using equipment (OT) Flowsheets (Taken 04/03/2021 1213) LTG: Pt will perform tub/shower stall transfers with assistance level of: Set up assist

## 2021-04-03 NOTE — Progress Notes (Signed)
[facility] Individual Statement of Services  Patient Name:  Willie Hernandez  Date:  04/03/2021  Welcome to the [facility].  Our goal is to provide you with an individualized program based on your diagnosis and situation, designed to meet your specific needs.  With this comprehensive rehabilitation program, you will be expected to participate in at least 3 hours of rehabilitation therapies Monday-Friday, with modified therapy programming on the weekends.  Your rehabilitation program will include the following services:  Physical Therapy (PT), Occupational Therapy (OT), 24 hour per day rehabilitation nursing, Care Coordinator, Rehabilitation Medicine, Nutrition Services, and Pharmacy Services  Weekly team conferences will be held on Tuesday to discuss your progress.  Your Inpatient Rehabilitation Care Coordinator will talk with you frequently to get your input and to update you on team discussions.  Team conferences with you and your family in attendance may also be held.  Expected length of stay: 7-10 days  Overall anticipated outcome: Independent -supervision with device  Depending on your progress and recovery, your program may change. Your Inpatient Rehabilitation Care Coordinator will coordinate services and will keep you informed of any changes. Your Inpatient Rehabilitation Care Coordinator's name and contact numbers are listed  below.  The following services may also be recommended but are not provided by the [facility] will be made to provide these services after discharge if needed.  Arrangements include referral to agencies that provide these services.  Your insurance has been verified to be:  [facility] Your primary doctor is:  Lavone Orn  Pertinent information will be shared with your  doctor and your insurance company.  Inpatient Rehabilitation Care Coordinator:  Ovidio Kin, [place] or Emilia Beck  Information discussed with and copy given to patient by: Elease Hashimoto, 04/03/2021, 10:18 AM

## 2021-04-04 DIAGNOSIS — S32592G Other specified fracture of left pubis, subsequent encounter for fracture with delayed healing: Secondary | ICD-10-CM | POA: Diagnosis not present

## 2021-04-04 DIAGNOSIS — T1490XA Injury, unspecified, initial encounter: Secondary | ICD-10-CM | POA: Diagnosis not present
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
## 2021-04-04 NOTE — Progress Notes (Signed)
Physical Therapy Session Note  Patient Details  Name: Willie Hernandez MRN: [id number] Date of Birth: 1955-08-08  Today's Date: 04/04/2021 PT Individual Time: [id number]; [id number] PT Individual Time Calculation (min): 59 min and 36 min  Short Term Goals: Week 1:  PT Short Term Goal 1 (Week 1): =LTGs d/t ELOS  Skilled Therapeutic Interventions/Progress Updates:  Session 1 Pt received supine in bed, awake and alert, denied pain, very verbose throughout session. Dr. Naaman Plummer present for rounds. Pt performed bed mobility w/min A for trunk support and donned shoes at EOB w/min A. Sit <>stand from EOB to [place] w/CGA, verbal cues for correct hand placement and to extend LLE to adhere to NWB precautions. Stand pivot to [facility] w/PFRW and CGA. Pt self-propelled in WC >150' using hemi-technique on R side w/supervision. Sit <>stand from [facility] to [place] w/correct hand placement, decent adherence to WB precautions. Pt ambulated 180' w/PFRW and CGA using step-to pattern, verbal cues to maintain upright posture and keep head up. Noted L trunk lean 2/2 L platform being too short, resulting in increased difficulty maintaining precautions on LUE and LLE. Stand <>sit w/CGA, verbal cues to reach back to chair. Adjusted L PF height to minimize trunk lean. After few minutes of rest, sit <>stand from [facility] to [place] w/CGA, noted improved posture and trunk alignment. Pt ambulated 50' w/PFRW and CGA prior to reaching fatigue, self-propelled 100' back to room using hemi-technique w/ supervision. Instructed pt to park [facility] close to recliner for stand pivot set-up, pt decided to stand from unlocked WC instead w/no AD, disregarding all of therapist's cues, and walked 10' to recliner with RUE support on bedrail w/CGA. Educated pt on high importance of maintaining WB precautions, ensuring WC is locked prior to standing, and listening to therapist. Pt verbalized understanding. Pt was left seated in recliner, all needs in reach.    Session 2  Pt  received sitting in WC in room, denied pain and was agreeable to PT. Educated and visually demonstrated extending LLE ("kickstand") prior to standing/sitting to eliminate possibility of weightbearing on LLE while standing/ambulating. Pt verbalized understanding and was able to demonstrate during sit <>stand from recliner to [place] w/CGA. Also visually demonstrated TTWB, pt able to demonstrate throughout session. Pt ambulated 15' from recliner to [facility] in room demonstrating proper TTWB w/CGA. Stand <>sit w/CGA, verbal cues for LLE extension prior to sitting and proper hand placement. Pt self-propelled >200' to ADL suite w/hemi-technique. Sit <>stand from [facility] to [place] w/CGA and proper technique. Pt practiced bed transfer in ADL suite x3 w/supervision, suggested pt manage legs into/out of bed using hook method and pt able to demonstrate. Sit <>stand from bed to [place] w/CGA, proper LLE extension. Stand pivot to [facility] w/PFRW w/CGA and pt self-propelled >200' w/supervision using hemi-technique back to room. Sit <>stand pivot to recliner in room w/PFRW and CGA. Pt was left sitting in recliner w/wife in room, all needs in reach.   Therapy Documentation Precautions:  Precautions Precautions: Fall Required Braces or Orthoses: Sling (for comfort) Restrictions Weight Bearing Restrictions: Yes LUE Weight Bearing: Partial weight bearing LUE Partial Weight Bearing Percentage or Pounds: with sling for comfort, per MD order: Left shoulder pendulums and advance as tolerated up to Forward Flexion and Abduction to 90 degrees LLE Weight Bearing: Touchdown weight bearing   Therapy/Group: Individual Therapy Cruzita Lederer Henslee Lottman, PT, DPT  04/04/2021, 7:41 AM

## 2021-04-05 MED ORDER — INFLUENZA VAC A&B SA ADJ QUAD 0.5 ML IM PRSY
0.5000 mL | PREFILLED_SYRINGE | INTRAMUSCULAR | Status: DC
  Filled 2021-04-05 (×2): qty 0.5

## 2021-04-05 MED ORDER — POLYETHYLENE GLYCOL 3350 17 G PO PACK
17.0000 g | PACK | Freq: Two times a day (BID) | ORAL | Status: DC | PRN
  Administered 2021-04-05: 17 g via ORAL
  Filled 2021-04-05: qty 1
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
## 2021-04-05 NOTE — Progress Notes (Signed)
Occupational Therapy Session Note  Patient Details  Name: Willie Hernandez MRN: [id number] Date of Birth: Dec 26, 1955  Today's Date: 04/05/2021 OT Group Time: 1100-1200 OT Group Time Calculation (min): 60 min   Skilled Therapeutic Interventions/Progress Updates:    Pt engaged in therapeutic w/c level dance group focusing on patient choice, UE/LE strengthening, salience, activity tolerance, and social participation. Pt was guided through various dance-based exercises involving UEs/LEs and trunk. All music was selected by group members. Emphasis placed on gentle ROM of his affected Lt side while adhering to medical precautions. Pt did well with active and active assist Lt LE ROM with min demonstrational cues, performed seated pendulums and Lt UE ROM with shoulder <90 degrees. Pt reported feeling good at end of session but planning to ask RN for some pain medicine. Pt left in care of RN at close of session.    Therapy Documentation Precautions:  Precautions Precautions: Fall Required Braces or Orthoses: Sling (for comfort) Restrictions Weight Bearing Restrictions: Yes LUE Weight Bearing: Partial weight bearing LUE Partial Weight Bearing Percentage or Pounds: with sling for comfort, per MD order: Left shoulder pendulums and advance as tolerated up to Forward Flexion and Abduction to 90 degrees LLE Weight Bearing: Touchdown weight bearing  Pain: in ribs, encouraged rest breaks during participation and left with RN for pain medicine at end of tx Pain Assessment Pain Scale: 0-10 Pain Score: 4  Pain Type: Acute pain Pain Location: Shoulder Pain Orientation: Left Pain Onset: On-going Patients Stated Pain Goal: 2 Pain Intervention(s): Medication (See eMAR)    Therapy/Group: Group Therapy  Ranen Doolin A Julen Rubert 04/05/2021, 12:45 PM

## 2021-04-05 NOTE — Progress Notes (Signed)
Slept ok. Requested to go back to bed at 0100. PRN Oxy ir given at 0104 and ice pack applied to left flank area. Willie Hernandez A

## 2021-04-05 NOTE — Discharge Instructions (Addendum)
Inpatient Rehab Discharge Instructions  Willie Hernandez Discharge date and time: 04/09/21   Activities/Precautions/ Functional Status: Activity: no lifting, driving, or strenuous exercise till cleared by MD.  --Continue to limit to touch down weight on right leg and partial weight on Left arm.  Diet: regular diet Wound Care: Wash with soap and water, Keep wound clean and dry. Contact Dr. Ninfa Linden if you develop any problems with your incision/wound--redness, swelling, increase in pain, drainage or if you develop fever or chills.     Functional status:  ___ No restrictions     ___ Walk up steps independently _X__ 24/7 supervision/assistance   ___ Walk up steps with assistance ___ Intermittent supervision/assistance  ___ Bathe/dress independently ___ Walk with walker     ___ Bathe/dress with assistance ___ Walk Independently    ___ Shower independently _X__ Walk with assistance    _X__ Shower with assistance _X__ No alcohol     ___ Return to work/school ________   Special Instructions: Continue above weight bearing restrictions till cleared by Dr. Ninfa Linden.    My questions have been answered and I understand these instructions. I will adhere to these goals and the provided educational materials after my discharge from the hospital.  Patient/Caregiver Signature _______________________________ Date __________  Clinician Signature _______________________________________ Date __________  Please bring this form and your medication list with you to all your follow-up doctor's appointments.     information on my medicine - XARELTO (Rivaroxaban)   This medication education was reviewed with me or my healthcare representative as part of my discharge preparation.    Why was Xarelto prescribed for you? Xarelto was prescribed for you to reduce the risk of blood clots forming after orthopedic surgery. The medical term for these abnormal blood clots is venous thromboembolism  (VTE).  What do you need to know about xarelto ? Take your Xarelto ONCE DAILY at the same time every day. You may take it either with or without food.  If you have difficulty swallowing the tablet whole, you may crush it and mix in applesauce just prior to taking your dose.  Take Xarelto exactly as prescribed by your doctor and DO NOT stop taking Xarelto without talking to the doctor who prescribed the medication.  Stopping without other VTE prevention medication to take the place of Xarelto may increase your risk of developing a clot.  After discharge, you should have regular check-up appointments with your healthcare provider that is prescribing your Xarelto.    What do you do if you miss a dose? If you miss a dose, take it as soon as you remember on the same day then continue your regularly scheduled once daily regimen the next day. Do not take two doses of Xarelto on the same day.   Important Safety Information A possible side effect of Xarelto is bleeding. You should call your healthcare provider right away if you experience any of the following: Bleeding from an injury or your nose that does not stop. Unusual colored urine (red or dark brown) or unusual colored stools (red or black). Unusual bruising for unknown reasons. A serious fall or if you hit your head (even if there is no bleeding).  Some medicines may interact with Xarelto and might increase your risk of bleeding while on Xarelto. To help avoid this, consult your healthcare provider or pharmacist prior to using any new prescription or non-prescription medications, including herbals, vitamins, non-steroidal anti-inflammatory drugs (NSAIDs) and supplements.  This website has more information on Xarelto: [email or web address]  COMMUNITY REFERRALS UPON DISCHARGE:    OUTPATIENT: PT & OT AGENCY: [facility] [place] [phone]  [phone] WILL CALL PATIENT OR WIFE TO SET UP  APPOINTMENTS  Medical Equipment/Items Ordered:LEFT PLATFORM [place]                                                 Agency/Supplier:[employer]  [phone]

## 2021-04-05 NOTE — Progress Notes (Signed)
Occupational Therapy Session Note  Patient Details  Name: Willie Hernandez MRN: [id number] Date of Birth: [birth year]  Today's Date: 04/06/2021 OT Individual Time: 1100-1206 OT Individual Time Calculation (min): 66 min    Short Term Goals: Week 1:  OT Short Term Goal 1 (Week 1): STGs=LTGs due to ELOS  Skilled Therapeutic Interventions/Progress Updates:    Pt greeted in the recliner, agreeable to shower today. He picked out his clothing and then used the [place] with supervision assist to transfer to the [facility] in shower. After OT covered aquacel dressings over Lt clavicle and pelvis (RN okay'd tegaderm over Lt flank drain site), pt bathed at seated level. Able to complete perihygiene using the cutout of the [facility] and also wash his Lt foot! Dressing was completed at sit<stand level using PFRW from the elevated toilet. Supervision for this as well. Pt concluded session by completing grooming tasks while standing at the sink. Min cues for maintaining TDWB with the Lt LE, pt did better with keeping forefoot vs entire foot on the floor during transfers. He is adamant that he is maintaining little to no weight on the affected leg. Min cues for maintaining ~90 degrees shoulder flexion on the Lt side during functional activity. Pt transferred to the recliner and was left with all needs within reach and chair alarm set. Tx focus placed on functional transfers, adaptive self care skills, and maintaining medical precautions during functional activity.   Therapy Documentation Precautions:  Precautions Precautions: Fall Required Braces or Orthoses: Sling (for comfort) Restrictions Weight Bearing Restrictions: Yes LUE Weight Bearing: Partial weight bearing LUE Partial Weight Bearing Percentage or Pounds: pendulums LLE Weight Bearing: Touchdown weight bearing    Pain: in ribs. Rest breaks provided during session to address and RN arrived to provide pain medicine as well Pain Assessment Pain Scale: 0-10 Pain  Score: 4  Pain Type: Acute pain Pain Location: Rib cage Pain Orientation: Left Pain Descriptors / Indicators: Aching Pain Frequency: Intermittent Pain Onset: On-going Patients Stated Pain Goal: 0 Pain Intervention(s): Medication (See eMAR) ADL: ADL Eating: Independent Where Assessed-Eating: Edge of bed Grooming: Setup Where Assessed-Grooming: Edge of bed Upper Body Bathing: Supervision/safety Where Assessed-Upper Body Bathing: Edge of bed Lower Body Bathing: Contact guard Where Assessed-Lower Body Bathing: Edge of bed Upper Body Dressing: Not assessed Lower Body Dressing: Contact guard Where Assessed-Lower Body Dressing: Edge of bed Toileting: Not assessed Toilet Transfer: Not assessed Tub/Shower Transfer: Not assessed      Therapy/Group: Individual Therapy  Alaena Strader A Terell Kincy 04/06/2021, 12:40 PM

## 2021-04-06 DIAGNOSIS — T1490XA Injury, unspecified, initial encounter: Secondary | ICD-10-CM | POA: Diagnosis not present

## 2021-04-06 LAB — CBC
HCT: 31.1 % — ABNORMAL LOW (ref 39.0–52.0)
Hemoglobin: 10.4 g/dL — ABNORMAL LOW (ref 13.0–17.0)
MCH: 31.7 pg (ref 26.0–34.0)
MCHC: 33.4 g/dL (ref 30.0–36.0)
MCV: 94.8 fL (ref 80.0–100.0)
Platelets: 345 10*3/uL (ref 150–400)
RBC: 3.28 MIL/uL — ABNORMAL LOW (ref 4.22–5.81)
RDW: 13.4 % (ref 11.5–15.5)
WBC: 6.2 10*3/uL (ref 4.0–10.5)
nRBC: 0 % (ref 0.0–0.2)

## 2021-04-06 LAB — BASIC METABOLIC PANEL
Anion gap: 7 (ref 5–15)
BUN: 10 mg/dL (ref 8–23)
CO2: 27 mmol/L (ref 22–32)
Calcium: 8.3 mg/dL — ABNORMAL LOW (ref 8.9–10.3)
Chloride: 103 mmol/L (ref 98–111)
Creatinine, Ser: 0.52 mg/dL — ABNORMAL LOW (ref 0.61–1.24)
GFR, Estimated: >60 mL/min (ref >60)
Glucose, Bld: 93 mg/dL (ref 70–99)
Potassium: 3.9 mmol/L (ref 3.5–5.1)
Sodium: 137 mmol/L (ref 135–145)

## 2021-04-06 NOTE — Progress Notes (Signed)
Patient is requesting to hold on to the flu vac until time for d/c. We continue to monitor.

## 2021-04-06 NOTE — Progress Notes (Signed)
Physical Therapy Session Note  Patient Details  Name: Willie Hernandez MRN: [id number] Date of Birth: Apr 10, 1956  Today's Date: 04/06/2021 PT Individual Time: 0915-1030 PT Individual Time Calculation (min): 75 min   Short Term Goals: Week 1:  PT Short Term Goal 1 (Week 1): =LTGs d/t ELOS  Skilled Therapeutic Interventions/Progress Updates:    Session 1: Pt received in recliner and agreeable to therapy.  Pt reports pain is well controlled with medication at this time. Pt performed ambulatory transfer to w/c with PFRW and CGA to supervision at times. Pt required occ cues for WB status but demoed improved adherence from previous sessions. Pt propelled w/c with RUE and RLE up to 300 ft with supervision for improved endurance and functional mobility. Pt performed obstacle course with W/c as described above to mimic tight passages in pt's home. Pt was able to navigate but expressed concern with standard w/c fitting. Retrieved 16x16 w/c and assessed for fit. Chair size was appropriate and pt expressed that he felt he has more maneuverability in smaller chair. Pt propelled chair in ADL apartment to feel the difference propelling on carpet. Discussed needs for d/c, including possible moving furniture or area rugs to accommodate a w/c. Discussed some strategies for the kitchen, therapist alerted OT that pt is interested in practicing these skills. Stand pivot transfer to mat table with PFRW and supervision for WB precaution. Pt performed eccentric marches with LLE 3 x 6 for improved strength and function in LLE. Pt returned to room and ambulated to recliner with close supervision and PFRW. Cues to take smaller steps to maintain WB. Pt remained in recliner at end of session and was left with all needs in reach and alarm active.   Session 2: Pt received in recliner and agreeable to therapy.  ambulatory transfer to w/c with supervision to monitor WB precautions. Pt propelled w/c with RLE and RUE up to 300 ft with  supervision for endurance and functional mobility. Session focused on stair training using backwards navigation and PFRW. Pt requires helper to put heavy pressure through L side of walker so pt can push through R side more effectively. Pt navigated 1 x 3" stair x 5, and 1 x 3 stairs per home environment. Pt requires cues to focus on task to maintain WB precaution. Pt performed the following exercises to promote LLE strength and endurance: -marches 3 x 12, pt able to lift LE w/o assistance, greatly improved from AM -LAQ 2 x 12 Pt returned to room and ambulated to recliner in same manner as above. Required incr cues for WB as he became more fatigued. Pt remained in recliner and was left with all needs in reach and alarm active.    Therapy Documentation Precautions:  Precautions Precautions: Fall Required Braces or Orthoses: Sling (for comfort) Restrictions Weight Bearing Restrictions: Yes LUE Weight Bearing: Partial weight bearing LUE Partial Weight Bearing Percentage or Pounds: pendulums LLE Weight Bearing: Touchdown weight bearing     Therapy/Group: Individual Therapy  Mickel Fuchs 04/06/2021, 12:27 PM
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
## 2021-04-06 NOTE — Progress Notes (Signed)
Slept good. Reports feeling like, "I might've over did", in group therapy. Ice pack applied to left flank area. PRN robaxin given at 1951 and PRN oxy ir given at 2114. Willie Hernandez A

## 2021-04-07 DIAGNOSIS — T1490XA Injury, unspecified, initial encounter: Secondary | ICD-10-CM | POA: Diagnosis not present

## 2021-04-07 MED ORDER — POLYETHYLENE GLYCOL 3350 17 G PO PACK
17.0000 g | PACK | Freq: Every day | ORAL | Status: DC
  Administered 2021-04-07 – 2021-04-10 (×4): 17 g via ORAL
  Filled 2021-04-07 (×4): qty 1

## 2021-04-07 MED ORDER — HYDROCERIN EX CREA
TOPICAL_CREAM | Freq: Two times a day (BID) | CUTANEOUS | Status: DC
  Filled 2021-04-07: qty 113
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
## 2021-04-07 NOTE — Progress Notes (Signed)
Physical Therapy Session Note  Patient Details  Name: Willie Hernandez MRN: [id number] Date of Birth: [birth year]  Today's Date: 04/07/2021 PT Individual Time: [id number], [id number] PT Individual Time Calculation (min): 53 min, 58 min  Short Term Goals: Week 1:  PT Short Term Goal 1 (Week 1): =LTGs d/t ELOS  Skilled Therapeutic Interventions/Progress Updates:    Session 1: Pt received in recliner and agreeable to therapy.  No complaint of pain. Pt's wife present, discussed home measurement sheet and d/c planning. Pt performed ambulatory transfer to w/c with supervision and min VC for WB precaution. Pt propelled w/c with RLE and RUE for endurance and functional mobility. Session focused on stair training using PFRW and backward technique. Pt performed single leg Sit to stand on RLE to prepare LE for hopping on to stair. Pt then navigated 3" steps with assist to stabilize walker x 6. Progressed 2 6" stair with same technique 3 x 1, 2 x 2 steps. Pt demoed fatigue and required frequent cueing to focus on maintaining UE WB precautions. Pt returned to room and recliner in same manner as above and was left with all needs in reach and alarm active.   Session 2: Pt received seated in recliner with no c/o pain at start of session. Pt performed ambulatory transfer to w/c with supervision and min cues for WB. Pt then propelled w/c with RUE and RLE to outdoor environment, navigating elevators, distances >300 ft, differing grades, and uneven surfaces for community reintegration and endurance.  Pt performed the following exercises to promote LLE strength and endurance:  -marches 3x12, demoing improving hip flexion strength from previous sessions -LAQ 3 x 12 -standing HS curl 3x12 - standing hip abduction 3 x 10  Pt returned to room and recliner in same manner as above. Pt was left with all needs in reach and alarm active.   Therapy Documentation Precautions:  Precautions Precautions: Fall Required Braces  or Orthoses: Sling (for comfort) Restrictions Weight Bearing Restrictions: Yes LUE Weight Bearing: Partial weight bearing LUE Partial Weight Bearing Percentage or Pounds: pendulums LLE Weight Bearing: Touchdown weight bearing     Therapy/Group: Individual Therapy  Mickel Fuchs 04/07/2021, 1:57 PM
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
## 2021-04-07 NOTE — Progress Notes (Signed)
Patient ID: Willie Hernandez, male   DOB: 1956/04/28, 65 y.o.   MRN: [id number]  met with pt and wife how is present in hs room to update regarding team conference goals of mod/I level and target discharge date of 9/16.  Both are pleased with his progress and wish to pursue OP therapies. Have ordered rolling walker and wheelchair and he will call [employer] to pay the co-pays. Will make referral to OP therapies and ask they contact wife to set up appointments. Have faxed [employer] and FMLA paperwork for pt and returned the originals to him. Work toward discharge Friday.

## 2021-04-07 NOTE — Progress Notes (Signed)
Occupational Therapy Session Note  Patient Details  Name: Willie Hernandez MRN: [id number] Date of Birth: 07-29-1955  Today's Date: 04/07/2021 OT Individual Time: 1530-1600 OT Individual Time Calculation (min): 30 min    Short Term Goals: Week 1:  OT Short Term Goal 1 (Week 1): STGs=LTGs due to ELOS   Skilled Therapeutic Interventions/Progress Updates:    Pt greeted at time of session sitting up in recliner agreeable to OT session, c/o mild hip pain but no # and did not impact session. Discussion with pt regarding functional modifications within WB restrictions and pt receptive. Pt also wanting to shower tomorrow, wanted to leave tegaderm on in prep for shower tomorrow. Pt doffing and donning shirt with Min A and cues for technique threading LUE first and pt receptive. Pt ambulating short distance with PFRW CGA recliner <> straight back chair with cues to maintain TDWB, pt did better with keeping LLE NWB. Up in recliner alarm on call bell in reach.   Therapy Documentation Precautions:  Precautions Precautions: Fall Required Braces or Orthoses: Sling (for comfort) Restrictions Weight Bearing Restrictions: Yes LUE Weight Bearing: Partial weight bearing LUE Partial Weight Bearing Percentage or Pounds: pendulums LLE Weight Bearing: Touchdown weight bearing     Therapy/Group: Individual Therapy  Viona Gilmore 04/07/2021, 11:52 AM

## 2021-04-07 NOTE — Progress Notes (Signed)
Occupational Therapy Session Note  Patient Details  Name: Willie Hernandez MRN: [id number] Date of Birth: [birth year]  Today's Date: 04/07/2021 OT Individual Time: [id number] OT Individual Time Calculation (min): 62 min    Short Term Goals: Week 1:  OT Short Term Goal 1 (Week 1): STGs=LTGs due to ELOS  Skilled Therapeutic Interventions/Progress Updates:  Pt greeted seated in recliner agreeable to OT intervention. Session focus on iADL tasks in kitchen. Pt requried MOD A to don shoes needing assist to don left shoe. Pt able to verbalize all WB restrictions.  Pt completed ambulatory transfer from recliner>w/c with PFRW and CGA. Pt completed w/c mobility from room to kitchen use RUE/RLE only to propel w/c. Once in kitchen began problem solving through meal prep tasks in pts kithcen. Provided education on energy conservation strategies for kitchen tasks such as using walker bag to transport items, utilizing counter space to transport items, using Tupperware with lid to transport items and rearranging frequently used items within pts reach. Pt able to complete simulated meal prep task from RW level where pt reaching into pantry and cabinets with RUE to practice heating up microwave meal and collecting needed items with CGA. Pt completed w/c mobility back to room in similar fashion as previously indicated. Pt left seated in recliner with all needs within reach.   Therapy Documentation Precautions:  Precautions Precautions: Fall Required Braces or Orthoses: Sling (for comfort) Restrictions Weight Bearing Restrictions: Yes LUE Weight Bearing: Partial weight bearing LUE Partial Weight Bearing Percentage or Pounds: pendulums LLE Weight Bearing: Touchdown weight bearing  Pain: pt reports no pain during session.     Therapy/Group: Individual Therapy  Corinne Ports [facility] 04/07/2021, 9:03 AM

## 2021-04-08 ENCOUNTER — Telehealth: Payer: Self-pay

## 2021-04-08 DIAGNOSIS — D62 Acute posthemorrhagic anemia: Secondary | ICD-10-CM | POA: Diagnosis not present

## 2021-04-08 DIAGNOSIS — T07XXXA Unspecified multiple injuries, initial encounter: Secondary | ICD-10-CM

## 2021-04-08 DIAGNOSIS — K5903 Drug induced constipation: Secondary | ICD-10-CM

## 2021-04-08 DIAGNOSIS — T1490XA Injury, unspecified, initial encounter: Secondary | ICD-10-CM | POA: Diagnosis not present

## 2021-04-08 NOTE — Progress Notes (Signed)
Physical Therapy Session Note  Patient Details  Name: Willie Hernandez MRN: [id number] Date of Birth: October 02, 1955  Today's Date: 04/08/2021 PT Individual Time: [id number] PT Individual Time Calculation (min): 64 min   Short Term Goals: Week 1:  PT Short Term Goal 1 (Week 1): =LTGs d/t ELOS  Skilled Therapeutic Interventions/Progress Updates:    Pt received in recliner and agreeable to therapy.  No complaint of pain. ambulatory transfer to w/c with [place]  supervision. Pt continues to require cueing for WB precautions, especially when distracted. Session focused on family education with the pt's  wife, Willie Hernandez. Pt performed car transfer from various heights to mimic different cars with supervision throughout. Occ cues for technique to maintain WB precautions. Pt then navigated both 3" and 6" steps, 2 x 2, with backward technique and PFRW, to instruct pt's wife on assistance with backward technique. Tricia demoed appropriate guarding technique with minimal cueing. Pt propelled w/c with RUE and RLE for endurance and functional mobility throughout session, up to 250 ft. Discussed d/c planning with pt and Tricia throughout session. Pt returned to recliner in same manner as above and was left with all needs in reach and alarm active.   Therapy Documentation Precautions:  Precautions Precautions: Fall Required Braces or Orthoses: Sling (for comfort) Restrictions Weight Bearing Restrictions: Yes LUE Weight Bearing: Partial weight bearing LUE Partial Weight Bearing Percentage or Pounds: pendulums LLE Weight Bearing: Touchdown weight bearing     Therapy/Group: Individual Therapy  Mickel Fuchs 04/08/2021, 4:10 PM

## 2021-04-08 NOTE — Progress Notes (Signed)
Physical Therapy Session Note  Patient Details  Name: Willie Hernandez MRN: [id number] Date of Birth: June 27, 1956  Today's Date: 04/08/2021 PT Individual Time: [id number] PT Individual Time Calculation (min): 32 min   Short Term Goals: Week 1:  PT Short Term Goal 1 (Week 1): =LTGs d/t ELOS  Skilled Therapeutic Interventions/Progress Updates:    Pt received sitting in recliner with his wife present as hand-off from [place], [place]. Pt agreeable to continue with this therapy session and requesting to focus on L LE exercises. Stand pivot recliner<>EOB using L PFRW with close supervision for safety - pt demos excellent carryover of training form primary therapist on how to set-up for transfer via extending L LE and limited pushing up with L UE to maintain WBing precautions all without cuing.  Performed the following exercises:  - seated EOB L LE long arc quads x15 reps - standing L hip flexion/march 2x15 reps - standing L LE hamstring curls x15 reps - standing L LE hip abduction x15 reps - standing L hip extension x15 reps  Completed with cuing throughout for proper form/technique including sustained R weight shift onto stance limb throughout to limit L UE WBing and for added balance challenge. Close supervision for safety. At end of session pt left seated in recliner with needs in reach and chair alarm on.   Therapy Documentation Precautions:  Precautions Precautions: Fall Required Braces or Orthoses: Sling (for comfort) Restrictions Weight Bearing Restrictions: Yes LUE Weight Bearing: Partial weight bearing LUE Partial Weight Bearing Percentage or Pounds: pendulums LLE Weight Bearing: Touchdown weight bearing   Pain: Pt visually appears to have discomfort in L LE but does not complain of pain and reports pre-medicated.     Therapy/Group: Individual Therapy  Tawana Scale , PT, DPT, NCS, CSRS 04/08/2021, 3:14 PM

## 2021-04-08 NOTE — Telephone Encounter (Signed)
Looks like I can work him in next Tuesday the 20th. Is that ok or do you want to see him tomorrow?

## 2021-04-08 NOTE — Telephone Encounter (Signed)
Pts wife called wanting to get her husband in to be seen. She wanted to know if we can get him in sooner than the first of October ?
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
## 2021-04-08 NOTE — Telephone Encounter (Signed)
Lvm for pt to cb 

## 2021-04-08 NOTE — Progress Notes (Signed)
Occupational Therapy Session Note  Patient Details  Name: Willie Hernandez MRN: [id number] Date of Birth: 1955-12-17  Today's Date: 04/08/2021 OT Individual Time: [id number] and [id number] OT Individual Time Calculation (min): 70 min and 44 min   Short Term Goals: Week 1:  OT Short Term Goal 1 (Week 1): STGs=LTGs due to ELOS   Skilled Therapeutic Interventions/Progress Updates:    Session 1: Pt greeted at time of session sitting up in recliner agreeable to OT session, no reports of pain throughout. Discussion regarding home set up, placement of items for shower, and pt retrieving items from seated position before ambulating to bathroom close supervision with [place]. Walk in shower transfer posterior entry method with CGA and simulated step over like home environment. Doffed clothing seated and UB/LB bathing Set up assist. Dried off same manner and Mod A to don gripper socks. Dressed seated on bench, Supervision for UB dressing threading LUE first and LB dressing Supervision as well for underwear and shorts reminding pt to sit to thread instead of standing for safety. Ambulated back to recliner same as above and set up with alarm on call bell in reach.   Session 2: Pt greeted at time of session sitting up in recliner with wife [name] present and remained throughout session. No pain reported. Focus of session on problem solving through maneuvering bathroom at home, pt does have tub shower in main bathroom but this tub does not have shower head. Pt's other bathroom with walk in shower has approx 6" ledge with standard tile width and pt performed walk in shower transfer x4 trials with wife present for all and providing Supervision. Entered walk in shower with posterior entry method, cues for sequencing and when/how to place PFRW. Transported back to room and hand off to PT.    Therapy Documentation Precautions:  Precautions Precautions: Fall Required Braces or Orthoses: Sling (for  comfort) Restrictions Weight Bearing Restrictions: Yes LUE Weight Bearing: Partial weight bearing LUE Partial Weight Bearing Percentage or Pounds: pendulums LLE Weight Bearing: Touchdown weight bearing    Therapy/Group: Individual Therapy  Viona Gilmore 04/08/2021, 7:25 AM

## 2021-04-09 DIAGNOSIS — K5903 Drug induced constipation: Secondary | ICD-10-CM | POA: Diagnosis not present

## 2021-04-09 DIAGNOSIS — T07XXXA Unspecified multiple injuries, initial encounter: Secondary | ICD-10-CM | POA: Diagnosis not present

## 2021-04-09 DIAGNOSIS — T1490XA Injury, unspecified, initial encounter: Secondary | ICD-10-CM | POA: Diagnosis not present

## 2021-04-09 DIAGNOSIS — D62 Acute posthemorrhagic anemia: Secondary | ICD-10-CM | POA: Diagnosis not present

## 2021-04-09 MED ORDER — HYDROCERIN EX CREA: 1.0000 "application " | TOPICAL_CREAM | Freq: Two times a day (BID) | CUTANEOUS | 0 refills | Status: DC

## 2021-04-09 MED ORDER — RIVAROXABAN 10 MG PO TABS: 10.0000 mg | ORAL_TABLET | Freq: Every day | ORAL | 0 refills | Status: DC

## 2021-04-09 MED ORDER — OXYCODONE HCL 5 MG PO TABS: 5.0000 mg | ORAL_TABLET | Freq: Four times a day (QID) | ORAL | 0 refills | Status: DC | PRN

## 2021-04-09 MED ORDER — METHOCARBAMOL 500 MG PO TABS: 500.0000 mg | ORAL_TABLET | Freq: Four times a day (QID) | ORAL | 0 refills | Status: DC | PRN

## 2021-04-09 MED ORDER — TRAZODONE HCL 50 MG PO TABS: 25.0000 mg | ORAL_TABLET | Freq: Every evening | ORAL | 0 refills | Status: DC | PRN

## 2021-04-09 MED ORDER — LIDOCAINE 5 % EX PTCH: 3.0000 | MEDICATED_PATCH | CUTANEOUS | 0 refills | Status: DC

## 2021-04-09 MED ORDER — GABAPENTIN 300 MG PO CAPS: 300.0000 mg | ORAL_CAPSULE | Freq: Three times a day (TID) | ORAL | 0 refills | Status: DC

## 2021-04-09 MED ORDER — MUSCLE RUB 10-15 % EX CREA: 1.0000 "application " | TOPICAL_CREAM | Freq: Two times a day (BID) | CUTANEOUS | 0 refills | Status: DC | PRN

## 2021-04-09 MED ORDER — SENNOSIDES-DOCUSATE SODIUM 8.6-50 MG PO TABS: 2.0000 | ORAL_TABLET | Freq: Every day | ORAL | 0 refills | Status: DC

## 2021-04-09 MED ORDER — POLYETHYLENE GLYCOL 3350 17 G PO PACK: 17.0000 g | PACK | Freq: Every day | ORAL | 0 refills | Status: DC

## 2021-04-09 NOTE — Progress Notes (Signed)
Occupational Therapy Discharge Summary  Patient Details  Name: Willie Hernandez MRN: [id number] Date of Birth: October 31, 1955    Patient has met 8 of 8 long term goals due to improved activity tolerance, improved balance, postural control, ability to compensate for deficits, improved awareness, and improved coordination.  Patient to discharge at overall Modified Independent level.  Patient's care partner is independent to provide the necessary physical assistance at discharge.  Pt is Mod I with BADLs for dressing, toileting, and ADL transfers with PFRW, Set up for shower level bathing and walk in shower transfer. Wife Willie Hernandez has been present for several sessions and feels comfortable to provide physical assist as needed. Wife trained on functional transfers, maneuvering small spaces in house, and how to cover incisions for shower.   Reasons goals not met: NA  Recommendation:  Patient will benefit from ongoing skilled OT services in outpatient setting to continue to advance functional skills in the area of BADL, iADL, and Reduce care partner burden.  Equipment: No equipment provided Pt has BSC, to borrow shower seat from a friend  Reasons for discharge: treatment goals met and discharge from hospital  Patient/family agrees with progress made and goals achieved: Yes  OT Discharge Precautions/Restrictions  Precautions Precautions: Fall Restrictions Weight Bearing Restrictions: Yes LUE Weight Bearing: Partial weight bearing LUE Partial Weight Bearing Percentage or Pounds: 50% LLE Weight Bearing: Touchdown weight bearing Vital Signs Therapy Vitals Temp: 97.9 F (36.6 C) Pulse Rate: (!) 56 Resp: 17 BP: (!) 145/78 Patient Position (if appropriate): Sitting Oxygen Therapy SpO2: 99 % O2 Device: Room Air Pain Pain Assessment Pain Scale: 0-10 Pain Score: 0-No pain ADL ADL Eating: Independent (per staff) Where Assessed-Eating: Edge of bed Grooming: Modified independent Where  Assessed-Grooming: Edge of bed Upper Body Bathing: Setup Where Assessed-Upper Body Bathing: Edge of bed Lower Body Bathing: Setup Where Assessed-Lower Body Bathing: Shower Upper Body Dressing: Modified independent (Device) Lower Body Dressing: Modified independent Where Assessed-Lower Body Dressing: Edge of bed Toileting: Modified independent Toilet Transfer: Modified independent Tub/Shower Transfer: Not assessed Social research officer, government: Modified independent (Set up) Social research officer, government Method: Ambulating Vision Baseline Vision/History:  (at baseline) Patient Visual Report: No change from baseline Vision Assessment?: No apparent visual deficits Perception  Perception: Within Functional Limits Praxis Praxis: Intact Cognition Overall Cognitive Status: Within Functional Limits for tasks assessed Arousal/Alertness: Awake/alert Orientation Level: Oriented X4 Year: 2022 Month: September Day of Week: Correct Memory: Appears intact Immediate Memory Recall: Sock;Blue;Bed Memory Recall Sock: Without Cue Memory Recall Blue: Without Cue Memory Recall Bed: Without Cue Awareness: Appears intact Problem Solving: Appears intact Safety/Judgment: Appears intact Comments: Good carryover of education in regards to precaution adherence during functional activity Sensation Sensation Light Touch: Appears Intact Proprioception: Appears Intact Coordination Gross Motor Movements are Fluid and Coordinated: No Fine Motor Movements are Fluid and Coordinated: Yes Coordination and Movement Description: Gross motor mildly affected by movement modifications due to WB restrictions Finger Nose Finger Test: WNL bilaterally Motor  Motor Motor: Within Functional Limits Motor - Skilled Clinical Observations: Affected by WB restrictions, mild debility from hospitalization Motor - Discharge Observations: Greatly improved from baseline, mildly uncoordinated with WB restrictions Mobility  Bed  Mobility Bed Mobility: Sit to Supine Sit to Supine: Independent with assistive device Transfers Sit to Stand: Independent with assistive device Stand to Sit: Independent with assistive device  Trunk/Postural Assessment  Cervical Assessment Cervical Assessment: Within Functional Limits Thoracic Assessment Thoracic Assessment: Within Functional Limits Lumbar Assessment Lumbar Assessment: Within Functional Limits Postural Control Postural Control:  Within Functional Limits  Balance Balance Balance Assessed: Yes Dynamic Sitting Balance Dynamic Sitting - Balance Support: Feet supported Dynamic Sitting - Level of Assistance: 6: Modified independent (Device/Increase time) Dynamic Sitting - Balance Activities: Lateral lean/weight shifting;Forward lean/weight shifting;Reaching for objects Dynamic Standing Balance Dynamic Standing - Balance Support: During functional activity;No upper extremity supported Dynamic Standing - Level of Assistance: 6: Modified independent (Device/Increase time) Dynamic Standing - Balance Activities: Lateral lean/weight shifting;Forward lean/weight shifting;Reaching for objects Extremity/Trunk Assessment RUE Assessment RUE Assessment: Within Functional Limits LUE Assessment LUE Assessment: Exceptions to [facility] Active Range of Motion (AROM) Comments: Shoulder ROM ~90 degrees within precautions General Strength Comments: DNT d/t WB precautions   Viona Gilmore 04/09/2021, 5:13 PM

## 2021-04-09 NOTE — Progress Notes (Signed)
Occupational Therapy Session Note  Patient Details  Name: Willie Hernandez MRN: [id number] Date of Birth: [birth year]  Today's Date: 04/09/2021 OT Individual Time: [id number] OT Individual Time Calculation (min): 69 min    Short Term Goals: Week 1:  OT Short Term Goal 1 (Week 1): STGs=LTGs due to ELOS  Skilled Therapeutic Interventions/Progress Updates:    Pt resting in recliner upon arrival. Pt agreeable to therapy and washing and changing clothing at sink. Pt amb with PFRW from recliner to sink and sat in w/c for bathing/dressing at sink. See Care Tool for assist levels. Supervision for amb with PFRW and standing at sink. Min verbal cues for adhering to WB precautions. Pt can verbalized WB precautions. Issued walker bag after discussing ways to transport coffee in the morning. Pt owns Yeti coffee mug with screw top that he can place in walker bag. Pt amb with PFRW and returned to recliner. Pt remained in relciner with all needs within reach. MD present.   Therapy Documentation Precautions:  Precautions Precautions: Fall Required Braces or Orthoses: Sling (for comfort) Restrictions Weight Bearing Restrictions: Yes LUE Weight Bearing: Partial weight bearing LUE Partial Weight Bearing Percentage or Pounds: pendulums LLE Weight Bearing: Touchdown weight bearing Pain: Pt reports that his pain is "ok" this morning   Therapy/Group: Individual Therapy  Leroy Libman 04/09/2021, 9:26 AM

## 2021-04-09 NOTE — Progress Notes (Signed)
Occupational Therapy Session Note  Patient Details  Name: Willie Hernandez MRN: [id number] Date of Birth: 1955-11-04  Today's Date: 04/09/2021 OT Individual Time: [id number] OT Individual Time Calculation (min): 70 min    Short Term Goals: Week 1:  OT Short Term Goal 1 (Week 1): STGs=LTGs due to ELOS   Skilled Therapeutic Interventions/Progress Updates:    Pt greeted at time of session sitting up in recliner with wife present who remained throughout session. No pain reported. Focus of session initially on answering patient/family questions regarding covering incisions as pt's aquacel has been removed. Demonstrated technique with tegaderm and gauze, aware of where to purchase. Pt ambualting recliner > sink level for review of pendulum swings, stabilizing self on sink with RUE and using body mechanics and gross movements for gentle ROM to L shoulder, clockwise and counter clockwise. Self propel wheelchair with RUE and RLE to/from room <> ADL apartment Mod I and focused on side stepping to simulate narrow bathroom at home (21" clearance) and PFRW is 19", side stepping and performing posterior entry method for walk in shower x3 trials with Set up assist. Back in room, set up in recliner call bell in reach and checked off wife to perform ambulatory toilet transfers with the pt, communicated with nursing as well. Pt and wife grateful for education and feel confident about going home.   Therapy Documentation Precautions:  Precautions Precautions: Fall Required Braces or Orthoses: Sling (for comfort) Restrictions Weight Bearing Restrictions: Yes LUE Weight Bearing: Partial weight bearing LUE Partial Weight Bearing Percentage or Pounds: pendulums LLE Weight Bearing: Touchdown weight bearing     Therapy/Group: Individual Therapy  Viona Gilmore 04/09/2021, 12:56 PM

## 2021-04-09 NOTE — Telephone Encounter (Signed)
Left 2nd voicemail.
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
## 2021-04-09 NOTE — Progress Notes (Signed)
All staples removed from, patient's chest and hip, 1 remaining suture removed from patient's left 4th digit. Tolerated well. Mepilex dressings applied to patient's chest and hip. Dressing to chest tube site changed,.

## 2021-04-09 NOTE — Progress Notes (Signed)
Patient ID: Willie Hernandez, male   DOB: [birth year], 65 y.o.   MRN: [id number]  Met with pt and wife to confirm discharge tomorrow. Pt has his equipment in the room and wife aware OP to contact her regarding setting up appointment. She has set up appointment with Dr. Rush Farmer for Tuesday. Pt wants to use [facility] for medications. Have informed Pam-PA of this. See n am for any last minute questions.
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
## 2021-04-09 NOTE — Progress Notes (Signed)
Physical Therapy Discharge Summary  Patient Details  Name: Willie Hernandez MRN: [id number] Date of Birth: October 01, 1955  Today's Date: 04/09/2021 PT Individual Time: 8786-7672 PT Individual Time Calculation (min): 45 min    Patient has met 10 of 10 long term goals due to improved activity tolerance, improved balance, increased strength, ability to compensate for deficits, and improved coordination.  Patient to discharge at a wheelchair level Modified Independent.   Patient's care partner is independent to provide the necessary physical assistance at discharge. Pt mod I with all mobility. Pt's wife underwent family education and demoed appropriate assistance when required.   Reasons goals not met: NA  Recommendation:  Patient will benefit from ongoing skilled PT services in outpatient setting to continue to advance safe functional mobility, address ongoing impairments in functional mobility, strength, ROM, and minimize fall risk.  Equipment: L PFRW and w/c  Reasons for discharge: treatment goals met and discharge from hospital  Patient/family agrees with progress made and goals achieved: Yes  PT Discharge Precautions/Restrictions Precautions Precautions: Fall Restrictions Weight Bearing Restrictions: No LUE Weight Bearing: Partial weight bearing LUE Partial Weight Bearing Percentage or Pounds: 50% LLE Weight Bearing: Touchdown weight bearing Vital Signs Therapy Vitals Temp: 97.9 F (36.6 C) Pulse Rate: (!) 56 Resp: 17 BP: (!) 145/78 Patient Position (if appropriate): Sitting Oxygen Therapy SpO2: 99 % O2 Device: Room Air Pain   Pain Interference Pain Interference Pain Effect on Sleep: 3. Frequently Pain Interference with Therapy Activities: 1. Rarely or not at all Pain Interference with Day-to-Day Activities: 1. Rarely or not at all Vision/Perception  Vision - History Ability to See in Adequate Light: 0 Adequate Perception Perception: Within Functional  Limits Praxis Praxis: Intact  Cognition Overall Cognitive Status: Within Functional Limits for tasks assessed Arousal/Alertness: Awake/alert Year: 2022 Month: September Day of Week: Correct Memory: Appears intact Awareness: Appears intact Problem Solving: Appears intact Safety/Judgment: Appears intact Comments: Good carryover of education in regards to precaution adherence during functional activity Sensation Sensation Light Touch: Appears Intact Proprioception: Appears Intact Coordination Gross Motor Movements are Fluid and Coordinated: No Coordination and Movement Description: Gross motor mildly affected by pain and movement modifications due to [employer] restrictions Motor  Motor Motor: Within Functional Limits Motor - Skilled Clinical Observations: Affected by pain and WB restrictions, mild debility from hospitalization Motor - Discharge Observations: Greatly improved from baseline, mildly uncoordinated with WB restrictions  Mobility Bed Mobility Bed Mobility: Sit to Supine Sit to Supine: Independent with assistive device Transfers Transfers: Stand to Sit;Stand Pivot Transfers Stand to Sit: Independent with assistive device Stand Pivot Transfers: Independent with assistive device Transfer (Assistive device): Left platform walker Locomotion  Gait Ambulation: Yes Gait Assistance: Supervision/Verbal cueing Gait Distance (Feet): 100 Feet Assistive device: Left platform walker Gait Assistance Details: Verbal cues for precautions/safety;Verbal cues for safe use of DME/AE Gait Gait: Yes Gait Pattern: Step-to pattern;Poor foot clearance - left Gait velocity: decr Stairs / Additional Locomotion Stairs: Yes Stairs Assistance: Minimal Assistance - Patient > 75% Stair Management Technique: With walker Number of Stairs: 2 Height of Stairs: 6 Ramp: Independent with assistive device Pick up small object from the floor assist level: Supervision/Verbal cueing Pick up small object  from the floor assistive device: Kress Wheelchair Mobility Wheelchair Mobility: Yes Wheelchair Assistance: Independent with assistive device Wheelchair Propulsion: Right lower extremity;Right upper extremity Wheelchair Parts Management: Independent Distance: 300+ ft  Trunk/Postural Assessment  Cervical Assessment Cervical Assessment: Within Functional Limits Thoracic Assessment Thoracic Assessment: Within Functional Limits Lumbar Assessment Lumbar Assessment: Within  Functional Limits Postural Control Postural Control: Within Functional Limits  Balance Balance Balance Assessed: Yes Dynamic Sitting Balance Dynamic Sitting - Balance Support: Feet supported Dynamic Sitting - Level of Assistance: 6: Modified independent (Device/Increase time);7: Independent Dynamic Standing Balance Dynamic Standing - Balance Support: During functional activity;No upper extremity supported Dynamic Standing - Level of Assistance: 6: Modified independent (Device/Increase time) Extremity Assessment      RLE Assessment RLE Assessment: Within Functional Limits General Strength Comments: 4-/5 hip flexion, knee and ankle 5/5 LLE Assessment LLE Assessment: Exceptions to [facility] General Strength Comments: 4/5 hip flexion, 4+/5  Skilled Therapeutic Interventions/Progress Updates:  Pt received in recliner and agreeable to therapy.  No complaint of pain. Session focused on continued family education and d/c planning. Equipment had been delivered to pt's room so therapist adjust all equipment to appropriate height/length. Pt performed 6" stairs x 2 with min A from his wife to stabilize walker. Pt returned to room after session and therapist answered remaining questions for d/c. Pt remained in recliner after session and was left with all needs in reach.   Pt was issued HEP as documented below: Access Code: [email or web address] URL: [email or web address] Date: 04/09/2021 Prepared by: Ailene Rud  Program  Notes Most exercises are currently at 3 x 10, as this gets easier you can start adding more reps or sets. Either 4 x 10 or 3x12-15, etc.    Exercises Seated Long Arc Quad - 1 x daily - 7 x weekly - 3 sets - 12 reps Standing Hamstring Curl with Chair Support - 1 x daily - 7 x weekly - 3 sets - 10 reps Standing Hip Abduction with Counter Support - 1 x daily - 7 x weekly - 3 sets - 10 reps Seated March - 1 x daily - 7 x weekly - 3 sets - 10 reps Standing March with Counter Support - 1 x daily - 7 x weekly - 3 sets - 10 reps Seated Heel Raise - 1 x daily - 7 x weekly - 3 sets - 10 reps Seated Toe Raise - 1 x daily - 7 x weekly - 3 sets - 10 reps     Mickel Fuchs 04/09/2021, 4:23 PM

## 2021-04-09 NOTE — Progress Notes (Signed)
Inpatient Rehabilitation Care Coordinator Discharge Note   Patient Details  Name: Willie Hernandez MRN: [id number] Date of Birth: Aug 05, 1955   Discharge location: HOME WITH WIFE WHO CAN PROVIDE ASSIST  Length of Stay:  8 DAYS  Discharge activity level: MOD/I-SUPERVISION LEVEL  Home/community participation: ACTIVE  Patient response [id number] Literacy - How often do you need to have someone help you when you read instructions, pamphlets, or other written material from your doctor or pharmacy?: Never  Patient response [id number] Isolation - How often do you feel lonely or isolated from those around you?: Never  Services provided included: MD, RD, PT, OT, RN, CM, Pharmacy, SW  Financial Services:  Financial Services Utilized: [place]  Choices offered to/list presented to: PT AND WIFE  Follow-up services arranged:  Outpatient, DME, Patient/Family has no preference for HH/DME agencies    Outpatient Servicies: [facility] WILL CALL PT /WIFE TO SCHEDULE APPOINTMENTS DME : [facility]-WHEELCHAIR AND LEFT-PLATFORM [place]    Patient response to transportation need: Is the patient able to respond to transportation needs?: Yes In the past 12 months, has lack of transportation kept you from medical appointments or from getting medications?: No In the past 12 months, has lack of transportation kept you from meetings, work, or from getting things needed for daily living?: No    Comments (or additional information):WIFE WAS PRESENT AND IS AWARE OF PATIENT'S CARE NEEDS; PT WISHES TO BE AS INDEPENDENT AS POSSIBLE WITH WB ISSUES.  Patient/Family verbalized understanding of follow-up arrangements:  Yes  Individual responsible for coordination of the follow-up plan: PATRICIA-WIFE [phone]  Confirmed correct DME delivered: Elease Hashimoto 04/09/2021    Murlean Seelye, Gardiner Rhyme

## 2021-04-10 DIAGNOSIS — T1490XA Injury, unspecified, initial encounter: Secondary | ICD-10-CM | POA: Diagnosis not present

## 2021-04-10 DIAGNOSIS — T07XXXA Unspecified multiple injuries, initial encounter: Secondary | ICD-10-CM | POA: Diagnosis not present

## 2021-04-10 DIAGNOSIS — D62 Acute posthemorrhagic anemia: Secondary | ICD-10-CM | POA: Diagnosis not present

## 2021-04-10 DIAGNOSIS — K5903 Drug induced constipation: Secondary | ICD-10-CM | POA: Diagnosis not present

## 2021-04-10 MED ORDER — INFLUENZA VAC A&B SA ADJ QUAD 0.5 ML IM PRSY
0.5000 mL | PREFILLED_SYRINGE | Freq: Once | INTRAMUSCULAR | Status: AC
  Administered 2021-04-10: 0.5 mL via INTRAMUSCULAR
  Filled 2021-04-10: qty 0.5
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
## 2021-04-10 NOTE — Progress Notes (Signed)
Patient discharged to home accompanied by his wife.

## 2021-04-14 ENCOUNTER — Ambulatory Visit: Payer: Self-pay

## 2021-04-14 ENCOUNTER — Other Ambulatory Visit: Payer: Self-pay

## 2021-04-14 ENCOUNTER — Encounter: Payer: Self-pay | Admitting: Orthopaedic Surgery

## 2021-04-14 ENCOUNTER — Ambulatory Visit (INDEPENDENT_AMBULATORY_CARE_PROVIDER_SITE_OTHER): Payer: 59 | Admitting: Orthopaedic Surgery

## 2021-04-14 DIAGNOSIS — Z9889 Other specified postprocedural states: Secondary | ICD-10-CM | POA: Diagnosis not present

## 2021-04-14 DIAGNOSIS — S3210XD Unspecified fracture of sacrum, subsequent encounter for fracture with routine healing: Secondary | ICD-10-CM | POA: Diagnosis not present

## 2021-04-14 DIAGNOSIS — S42022D Displaced fracture of shaft of left clavicle, subsequent encounter for fracture with routine healing: Secondary | ICD-10-CM | POA: Diagnosis not present
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
## 2021-04-27 ENCOUNTER — Ambulatory Visit: Payer: 59

## 2021-04-29 ENCOUNTER — Ambulatory Visit (INDEPENDENT_AMBULATORY_CARE_PROVIDER_SITE_OTHER): Payer: 59 | Admitting: Physician Assistant

## 2021-04-29 ENCOUNTER — Other Ambulatory Visit: Payer: Self-pay

## 2021-04-29 ENCOUNTER — Encounter: Payer: Self-pay | Admitting: Physician Assistant

## 2021-04-29 DIAGNOSIS — S3210XD Unspecified fracture of sacrum, subsequent encounter for fracture with routine healing: Secondary | ICD-10-CM

## 2021-04-29 DIAGNOSIS — S42022D Displaced fracture of shaft of left clavicle, subsequent encounter for fracture with routine healing: Secondary | ICD-10-CM

## 2021-04-29 NOTE — Progress Notes (Signed)
HPI: Mr. Clauson returns today follow-up of his left clavicle fracture and pelvic fractures.  He will be 5 weeks status post surgery tomorrow from the pelvic fractures and the clavicle fracture.  He has been basically nonweightbearing left lower extremity with a platform walker.  He is on Xarelto for DVT prophylaxis.  He feels overall that he is improving.  He has no complaints otherwise.  Review of systems see HPI otherwise negative  Physical exam General well-developed well-nourished male no acute distress.  Ambulates nonweightbearing on the left lower extremity with a platform walker. Left upper extremity has good range of motion left hand wrist forearm.  No pain with internal or external rotation of the left shoulder shoulder moves fluidly.  Left hip excellent range of motion left hip without pain.  Dorsiflexion plantarflexion ankle intact.  Calf supple nontender.  Impression: Status post open reduction internal fixation left clavicle fracture 03/26/2021 Status post SI joint fixation Left rami fractures   Plan: At this point time he will remain on his Xarelto until he becomes weightbearing on the left lower extremity.  He can begin weightbearing as tolerated on the left lower extremity on October 13.  We will set him up for physical therapy to begin quad strengthening work on gait and balance here at our office.  In regards to the left upper extremity no heavy lifting no straining range of motion with overhead activity.  We will see him back in just 2 weeks to see how he is doing overall AP pelvis at that time.  Questions were encouraged and answered at length.

## 2021-05-02 ENCOUNTER — Other Ambulatory Visit: Payer: Self-pay | Admitting: Physical Medicine and Rehabilitation

## 2021-05-06 ENCOUNTER — Ambulatory Visit: Payer: 59 | Attending: Physical Medicine and Rehabilitation | Admitting: Physical Therapy

## 2021-05-06 ENCOUNTER — Other Ambulatory Visit: Payer: Self-pay

## 2021-05-06 ENCOUNTER — Encounter: Payer: Self-pay | Admitting: Physical Therapy

## 2021-05-06 DIAGNOSIS — M6281 Muscle weakness (generalized): Secondary | ICD-10-CM

## 2021-05-06 DIAGNOSIS — R2689 Other abnormalities of gait and mobility: Secondary | ICD-10-CM
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
## 2021-05-06 NOTE — Patient Instructions (Signed)
Access Code: [email or web address] URL: [email or web address] Date: 05/06/2021 Prepared by: Shearon Balo  Exercises Supine Active Straight Leg Raise - 1 x daily - 7 x weekly - 3 sets - 10 reps Supine Bridge - 1 x daily - 7 x weekly - 3 sets - 10 reps Sidelying Hip Abduction - 1 x daily - 7 x weekly - 3 sets - 8 reps Supine Shoulder Flexion Extension AAROM with [place] - 1 x daily - 7 x weekly - 3 sets - 10 reps

## 2021-05-12 ENCOUNTER — Ambulatory Visit: Payer: 59 | Admitting: Physical Therapy

## 2021-05-12 ENCOUNTER — Encounter: Payer: Self-pay | Admitting: Physical Therapy

## 2021-05-12 ENCOUNTER — Other Ambulatory Visit: Payer: Self-pay

## 2021-05-12 DIAGNOSIS — R2689 Other abnormalities of gait and mobility: Secondary | ICD-10-CM

## 2021-05-12 DIAGNOSIS — M6281 Muscle weakness (generalized): Secondary | ICD-10-CM

## 2021-05-12 NOTE — Therapy (Signed)
[place], [place], [address] Phone: [phone]   Fax:  [phone]  Physical Therapy Treatment  Patient Details  Name: Willie Hernandez MRN: [id number] Date of Birth: [birth year] Referring Provider (PT): Referred by: Courtney Heys, MD (04/09/2021)   Encounter Date: 05/12/2021   PT End of Session - 05/12/21 [place]     Visit Number 2    Number of Visits 16    Date for PT Re-Evaluation 07/01/21    Authorization Type UHC    Authorization - Number of Visits 76    PT Start Time 7867    PT Stop Time 1912    PT Time Calculation (min) 42 min    Behavior During Therapy WFL for tasks assessed/performed             Past Medical History:  Diagnosis Date   Allergic rhinitis    GERD (gastroesophageal reflux disease)    Hallux rigidus    with heel cord tightness, Dr. Sharol Given   Hearing loss in right ear    noise exposure   Hx of adenomatous colonic polyps 07/14/2017   Mitral valve prolapse    mild mitral regurgitation   PONV (postoperative nausea and vomiting)    from Versed   Rhegmatogenous retinal detachment of left eye    Spermatocele    Right, Dr. Risa Grill    Past Surgical History:  Procedure Laterality Date   COLONOSCOPY  1997, 2006   corrective jaw surgery  2001   for overbite   eye lens replaced  05/27/2018   at [facility] eye center   GAS INSERTION Left 09/07/2016   Procedure: INSERTION OF GAS-C3F8;  Surgeon: Hayden Pedro, MD;  Location: [place];  Service: Ophthalmology;  Laterality: Left;   LASER PHOTO ABLATION Bilateral 09/07/2016   Procedure: LASER PHOTO ABLATION RIGHT EYE;  Surgeon: Hayden Pedro, MD;  Location: [place];  Service: Ophthalmology;  Laterality: Bilateral;   laser retinal tears OS  04/2012   SCLERAL BUCKLE WITH CRYO Left 09/07/2016   Procedure: SCLERAL BUCKLE WITH CRYO;  Surgeon: Hayden Pedro, MD;  Location: [place];  Service: Ophthalmology;  Laterality: Left;   TOTAL HIP ARTHROPLASTY  Right 06/30/2018   Procedure: RIGHT TOTAL HIP ARTHROPLASTY ANTERIOR APPROACH;  Surgeon: Mcarthur Rossetti, MD;  Location: [facility];  Service: Orthopedics;  Laterality: Right;   UPPER GASTROINTESTINAL ENDOSCOPY     WISDOM TOOTH EXTRACTION     age 65    There were no vitals filed for this visit.   Subjective Assessment - 05/12/21 1841     Subjective Pt reports compliance with HEP.  He reports 0/10 pain today.  He will see MD tomorrow for x-ray.             [facility] PT Treatment/Exercise:  Therapeutic Exercise:  - nu-step L6 34m while taking subjective and planning session with patient - SLR -  2x10 ea (2.5#) - bridge - 10x 10'' - S/L clam - GTB - 3x10 - L shoulder flexion to 100 - AAROM - 2x20 - S/L ER - 2x15, 15x 1# - LAQ 5# - 3x10   Therapeutic Activity - collecting information for FOTO and reviewing with patient    PT Short Term Goals - 05/06/21 1801       PT SHORT TERM GOAL #1   Title Willie Hernandez will be >75% HEP compliant to improve carryover between sessions and facilitate independent management of condition    Target Date  05/27/21      PT SHORT TERM GOAL #2   Title --               PT Long Term Goals - 05/12/21 1843       PT LONG TERM GOAL #1   Title Willie Hernandez will improve 10 meter max gait speed to 1.2 m/s (.1 m/s MCID) to show functional improvement in ambulation  EVAL: .833 m/s with walker  target date: 07/01/21      PT LONG TERM GOAL #2   Title Willie Hernandez will be able to stand for >20'' in SLS stance, to show a significant improvement in balance in order to reduce fall risk  EVAL: not tested d/t pain  target date: 07/01/21      PT LONG TERM GOAL #3   Title Willie Hernandez will improve 30'' STS (MCID 2) to >/= 12x (w/ UE: N) to show improved LE strength and improved transfers  EVAL: Not tested d/t precautions  target date: 07/01/21      PT LONG TERM GOAL #4   Title Willie Hernandez will improve the  following MMTs to >/= 4/5 to show improvement in strength:  hip abd, hip ext, knee ext  EVAL: not tested  target date: 07/01/21      PT LONG TERM GOAL #5   Title Willie Hernandez will improve FOTO score from 61 (on evaluation) to 79 as a proxy for functional improvement  target date: 07/01/21                   Plan - 05/13/21 1147     Clinical Impression Statement Pt reports no increase in baseline pain following therapy  HEP was reviewed, but left unchanged    Overall, Willie Hernandez is progressing well with therapy.  Today we concentrated on core strengthening, hip strengthening, and shoulder range of motion.  Pt doing very well.  We will gradually increase load over the next couple of weeks.  As long as x-rays of sacral fixation show union, we will progress gait and w/b next session.  Pt will continue to benefit from skilled physical therapy to address remaining deficits and achieve listed goals.  Continue per POC.    Stability/Clinical Decision Making Stable/Uncomplicated    Rehab Potential Good    PT Frequency 2x / week    PT Duration 8 weeks    PT Treatment/Interventions ADLs/Self Care Home Management;Aquatic Therapy;Iontophoresis 4mg /ml Dexamethasone;Gait training;Therapeutic activities;Therapeutic exercise;Neuromuscular re-education;Manual techniques;Dry needling;Vasopneumatic Device;Spinal Manipulations    PT Next Visit Plan LE and UE MMT, progressive: balance, strengthening, return to activity    PT Home Exercise Plan [email or web address]    Consulted and Agree with Plan of Care Patient             Patient will benefit from skilled therapeutic intervention in order to improve the following deficits and impairments:  Abnormal gait, Decreased mobility, Decreased endurance, Difficulty walking, Decreased activity tolerance, Decreased strength, Impaired UE functional use, Pain  Visit Diagnosis: Other abnormalities of gait and mobility  Muscle weakness  Balance  problem     Problem List Patient Active Problem List   Diagnosis Date Noted   Multiple trauma    Acute blood loss anemia    Drug induced constipation    Trauma 04/02/2021   Fracture of multiple pubic rami, left, with delayed healing, subsequent encounter 04/02/2021   Status post total replacement of right hip 06/30/2018   Unilateral primary osteoarthritis, right  hip 05/08/2018   Hx of adenomatous colonic polyps 07/14/2017   Rhegmatogenous retinal detachment of left eye 09/07/2016   Multiple retinal breaks of right eye 09/07/2016   GERD (gastroesophageal reflux disease) 02/20/2013    Mathis Dad, PT 05/13/2021, 11:48 AM  [facility] [address] [place], [place], [address] Phone: [phone]   Fax:  [phone]  Name: Willie Hernandez MRN: [id number] Date of Birth: [birth year]

## 2021-05-13 ENCOUNTER — Ambulatory Visit: Payer: Self-pay

## 2021-05-13 ENCOUNTER — Encounter: Payer: Self-pay | Admitting: Physician Assistant

## 2021-05-13 ENCOUNTER — Ambulatory Visit (INDEPENDENT_AMBULATORY_CARE_PROVIDER_SITE_OTHER): Payer: 59 | Admitting: Physician Assistant

## 2021-05-13 DIAGNOSIS — S3210XD Unspecified fracture of sacrum, subsequent encounter for fracture with routine healing: Secondary | ICD-10-CM

## 2021-05-13 NOTE — Progress Notes (Signed)
HPI: Willie Hernandez returns today for follow-up of his left clavicle and pelvic fractures.  He states overall he is trending towards improvement.  He presents today using a platform rolling walker.  He is working with physical therapy.  He is now 7 weeks status post open reduction internal fixation left clavicle fracture and pelvic fracture.  He is no longer taking anticoagulants or any pain medications.  Physical exam: General well-developed well-nourished male no acute distress. Left shoulder excellent range of motion with full overhead activity abduction abduction and internal and external rotation.  Some prominence of the left clavicle hardware but no tenting of the skin.  Surgical incisions well-healed. Left hip: Good range of motion left hip without pain.  Left calf supple nontender.  Dorsiflexion plantarflexion left ankle intact.  Atrophy of the quad and hamstrings muscle was visible compared to the right side.  Radiographs: AP pelvis: Bilateral hips well located.  Status post right total hip arthroplasty well-seated components.  Status post pinning left SI joint.  Left rami fractures with no change in overall position alignment.  Signs of consolidation are seen.  No other fractures identified.  Impression: Left clavicle fracture Pelvic fractures  Plan: He is weightbearing as tolerated on left lower extremity.  He will continue to work with therapy for gait balance and strengthening of the lower leg.  He will follow-up with [place] in 1 month we will obtain an AP pelvis at that time.  Questions were encouraged and answered at length today.

## 2021-05-15 ENCOUNTER — Other Ambulatory Visit: Payer: Self-pay

## 2021-05-15 ENCOUNTER — Ambulatory Visit: Payer: 59 | Admitting: Physical Therapy

## 2021-05-15 DIAGNOSIS — M6281 Muscle weakness (generalized): Secondary | ICD-10-CM

## 2021-05-15 DIAGNOSIS — R2689 Other abnormalities of gait and mobility: Secondary | ICD-10-CM

## 2021-05-15 NOTE — Therapy (Signed)
[place], [place], [address] Phone: [phone]   Fax:  [phone]  Physical Therapy Treatment  Patient Details  Name: Willie Hernandez MRN: [id number] Date of Birth: 1955-08-29 Referring Provider (PT): Referred by: Courtney Heys, MD (04/09/2021)   Encounter Date: 05/15/2021   PT End of Session - 05/15/21 1131     Visit Number 3    Number of Visits 16    Date for PT Re-Evaluation 07/01/21    Authorization Type UHC    Authorization - Number of Visits 14    PT Start Time 8416    PT Stop Time 1215    PT Time Calculation (min) 44 min    Behavior During Therapy WFL for tasks assessed/performed             Past Medical History:  Diagnosis Date   Allergic rhinitis    GERD (gastroesophageal reflux disease)    Hallux rigidus    with heel cord tightness, Dr. Sharol Given   Hearing loss in right ear    noise exposure   Hx of adenomatous colonic polyps 07/14/2017   Mitral valve prolapse    mild mitral regurgitation   PONV (postoperative nausea and vomiting)    from Versed   Rhegmatogenous retinal detachment of left eye    Spermatocele    Right, Dr. Risa Grill    Past Surgical History:  Procedure Laterality Date   COLONOSCOPY  1997, 2006   corrective jaw surgery  2001   for overbite   eye lens replaced  05/27/2018   at [facility] eye center   GAS INSERTION Left 09/07/2016   Procedure: INSERTION OF GAS-C3F8;  Surgeon: Hayden Pedro, MD;  Location: [facility];  Service: Ophthalmology;  Laterality: Left;   LASER PHOTO ABLATION Bilateral 09/07/2016   Procedure: LASER PHOTO ABLATION RIGHT EYE;  Surgeon: Hayden Pedro, MD;  Location: [place];  Service: Ophthalmology;  Laterality: Bilateral;   laser retinal tears OS  04/2012   SCLERAL BUCKLE WITH CRYO Left 09/07/2016   Procedure: SCLERAL BUCKLE WITH CRYO;  Surgeon: Hayden Pedro, MD;  Location: [facility];  Service: Ophthalmology;  Laterality: Left;   TOTAL HIP ARTHROPLASTY  Right 06/30/2018   Procedure: RIGHT TOTAL HIP ARTHROPLASTY ANTERIOR APPROACH;  Surgeon: Mcarthur Rossetti, MD;  Location: [facility];  Service: Orthopedics;  Laterality: Right;   UPPER GASTROINTESTINAL ENDOSCOPY     WISDOM TOOTH EXTRACTION     age 65    There were no vitals filed for this visit.   Subjective Assessment - 05/15/21 1136     Subjective Pt is now WBAT L LE.  He is still hesitant to w/b, but is having no pain.  He reports 0/10 pain today.  He will see MD tomorrow for x-ray.               PT Education - 05/15/21 1206     Education Details HEP            [facility] Adult PT Treatment/Exercise:   Therapeutic Exercise:   - nu-step L7 71m while taking subjective and planning session with patient - weight shifts (lateral) 20x - SLR -  2x10 ea (2.5#) (NT) - bridge - 2x10 on swiss ball - S/L clam - GTB - 3x10 (NT) - L shoulder flexion to 100 - AAROM - 2# - 20x (NT) - S/L ER - 3x15 @2 # - LAQ 7.5# - 2x10 - working on gait, specifically heel strike with walker -  2x280'   Neuromuscular re-ed, to improve balance and reduce fall risk: - semi tandem 3x30'' - rocker board DF/PF 20x    PT Short Term Goals - 05/06/21 1801       PT SHORT TERM GOAL #1   Title Willie Hernandez will be >75% HEP compliant to improve carryover between sessions and facilitate independent management of condition    Target Date 05/27/21      PT SHORT TERM GOAL #2   Title --               PT Long Term Goals - 05/12/21 1843       PT LONG TERM GOAL #1   Title Willie Hernandez will improve 10 meter max gait speed to 1.2 m/s (.1 m/s MCID) to show functional improvement in ambulation  EVAL: .833 m/s with walker  target date: 07/01/21      PT LONG TERM GOAL #2   Title Willie Hernandez will be able to stand for >20'' in SLS stance, to show a significant improvement in balance in order to reduce fall risk  EVAL: not tested d/t pain  target date: 07/01/21      PT LONG TERM GOAL #3    Title Willie Hernandez will improve 30'' STS (MCID 2) to >/= 12x (w/ UE: N) to show improved LE strength and improved transfers  EVAL: Not tested d/t precautions  target date: 07/01/21      PT LONG TERM GOAL #4   Title Willie Hernandez will improve the following MMTs to >/= 4/5 to show improvement in strength:  hip abd, hip ext, knee ext  EVAL: not tested  target date: 07/01/21      PT LONG TERM GOAL #5   Title Willie Hernandez will improve FOTO score from 61 (on evaluation) to 79 as a proxy for functional improvement  target date: 07/01/21                   Plan - 05/15/21 1254     Clinical Impression Statement Pt reports no increase in baseline pain following therapy  HEP was updated and reissued to patient; pt educated on HEP, was provided handout, and verbally confirmed understanding of exercises.    Overall, Willie Hernandez is progressing very well with therapy.  Today we concentrated on hip strengthening, normalizing gait, balance/proprioception, and increasing w/b .  Pt does very well with PWB through L LE.  We will work toward trialing cane in about a week.  For now, I encouraged him to normalize gait with walker; he agrees to plan.  Pt will continue to benefit from skilled physical therapy to address remaining deficits and achieve listed goals.  Continue per POC.    Stability/Clinical Decision Making Stable/Uncomplicated    Rehab Potential Good    PT Frequency 2x / week    PT Duration 8 weeks    PT Treatment/Interventions ADLs/Self Care Home Management;Aquatic Therapy;Iontophoresis 4mg /ml Dexamethasone;Gait training;Therapeutic activities;Therapeutic exercise;Neuromuscular re-education;Manual techniques;Dry needling;Vasopneumatic Device;Spinal Manipulations    PT Next Visit Plan LE and UE MMT, progressive: balance, strengthening, return to activity    PT Home Exercise Plan [email or web address]    Consulted and Agree with Plan of Care Patient             Patient  will benefit from skilled therapeutic intervention in order to improve the following deficits and impairments:  Abnormal gait, Decreased mobility, Decreased endurance, Difficulty walking, Decreased activity tolerance, Decreased strength, Impaired UE  functional use, Pain  Visit Diagnosis: Other abnormalities of gait and mobility  Muscle weakness  Balance problem     Problem List Patient Active Problem List   Diagnosis Date Noted   Multiple trauma    Acute blood loss anemia    Drug induced constipation    Trauma 04/02/2021   Fracture of multiple pubic rami, left, with delayed healing, subsequent encounter 04/02/2021   Status post total replacement of right hip 06/30/2018   Unilateral primary osteoarthritis, right hip 05/08/2018   Hx of adenomatous colonic polyps 07/14/2017   Rhegmatogenous retinal detachment of left eye 09/07/2016   Multiple retinal breaks of right eye 09/07/2016   GERD (gastroesophageal reflux disease) 02/20/2013    Mathis Dad, PT 05/15/2021, 12:56 PM  [facility] [address] [place], [place], [address] Phone: [phone]   Fax:  [phone]  Name: Willie Hernandez MRN: [id number] Date of Birth: Dec 17, 1955

## 2021-05-15 NOTE — Patient Instructions (Signed)
Access Code: [email or web address] URL: [email or web address] Date: 05/15/2021 Prepared by: Shearon Balo  Exercises Supine Active Straight Leg Raise - 1 x daily - 7 x weekly - 3 sets - 10 reps Supine Bridge - 1 x daily - 7 x weekly - 3 sets - 10 reps Sidelying Hip Abduction - 1 x daily - 7 x weekly - 3 sets - 8 reps Supine Shoulder Flexion Extension AAROM with Dowel - 1 x daily - 7 x weekly - 3 sets - 10 reps Standing Romberg to 3/4 Tandem Stance - 1 x daily - 7 x weekly - 1 sets - 3 reps - 44'' hold

## 2021-05-20 ENCOUNTER — Ambulatory Visit: Payer: 59 | Admitting: Physical Therapy

## 2021-05-20 ENCOUNTER — Encounter: Payer: Self-pay | Admitting: Physical Therapy

## 2021-05-20 ENCOUNTER — Other Ambulatory Visit: Payer: Self-pay

## 2021-05-20 DIAGNOSIS — R2689 Other abnormalities of gait and mobility: Secondary | ICD-10-CM | POA: Diagnosis not present

## 2021-05-20 DIAGNOSIS — M6281 Muscle weakness (generalized): Secondary | ICD-10-CM

## 2021-05-20 NOTE — Therapy (Signed)
[place], [place], [address] Phone: [phone]   Fax:  [phone]  Physical Therapy Treatment  Patient Details  Name: Willie Hernandez MRN: [id number] Date of Birth: 1955-12-31 Referring Provider (PT): Referred by: Courtney Heys, MD (04/09/2021)   Encounter Date: 05/20/2021   PT End of Session - 05/20/21 1534     Visit Number 4    Number of Visits 16    Date for PT Re-Evaluation 07/01/21    Authorization Type UHC    Authorization - Number of Visits 5    PT Start Time 8341    PT Stop Time 9622    PT Time Calculation (min) 41 min    Behavior During Therapy WFL for tasks assessed/performed             Past Medical History:  Diagnosis Date   Allergic rhinitis    GERD (gastroesophageal reflux disease)    Hallux rigidus    with heel cord tightness, Dr. Sharol Given   Hearing loss in right ear    noise exposure   Hx of adenomatous colonic polyps 07/14/2017   Mitral valve prolapse    mild mitral regurgitation   PONV (postoperative nausea and vomiting)    from Versed   Rhegmatogenous retinal detachment of left eye    Spermatocele    Right, Dr. Risa Grill    Past Surgical History:  Procedure Laterality Date   COLONOSCOPY  1997, 2006   corrective jaw surgery  2001   for overbite   eye lens replaced  05/27/2018   at [facility] eye center   GAS INSERTION Left 09/07/2016   Procedure: INSERTION OF GAS-C3F8;  Surgeon: Hayden Pedro, MD;  Location: [facility];  Service: Ophthalmology;  Laterality: Left;   LASER PHOTO ABLATION Bilateral 09/07/2016   Procedure: LASER PHOTO ABLATION RIGHT EYE;  Surgeon: Hayden Pedro, MD;  Location: [facility];  Service: Ophthalmology;  Laterality: Bilateral;   laser retinal tears OS  04/2012   SCLERAL BUCKLE WITH CRYO Left 09/07/2016   Procedure: SCLERAL BUCKLE WITH CRYO;  Surgeon: Hayden Pedro, MD;  Location: [facility];  Service: Ophthalmology;  Laterality: Left;   TOTAL HIP ARTHROPLASTY  Right 06/30/2018   Procedure: RIGHT TOTAL HIP ARTHROPLASTY ANTERIOR APPROACH;  Surgeon: Mcarthur Rossetti, MD;  [facility];  Service: Orthopedics;  Laterality: Right;   UPPER GASTROINTESTINAL ENDOSCOPY     WISDOM TOOTH EXTRACTION     age 65    There were no vitals filed for this visit.   Subjective Assessment - 05/20/21 1538     Subjective Pt reports that his LE WB has been going well.  He reports 0/10 pain today.             [place] Adult PT Treatment/Exercise:   Therapeutic Exercise:   - nu-step L8 20m while taking subjective and planning session with patient - slant board stretch - 45''x2 - step up 4'' step - 2x10 lat and fwd L LE with UE support - heel raises on step - 2x15   Neuromuscular re-ed, to improve balance and reduce fall risk: -  tandem 3x30'' - rocker board DF/PF 20x - Semi tandem 3x30'' on foam  Gait training - 36' x2 with [facility] for cues for heel strike and heel off L LE      PT Short Term Goals - 05/06/21 1801       PT SHORT TERM GOAL #1   Title Willie Hernandez will be >  75% HEP compliant to improve carryover between sessions and facilitate independent management of condition    Target Date 05/27/21      PT SHORT TERM GOAL #2   Title --               PT Long Term Goals - 05/12/21 1843       PT LONG TERM GOAL #1   Title Willie Hernandez will improve 10 meter max gait speed to 1.2 m/s (.1 m/s MCID) to show functional improvement in ambulation  EVAL: .833 m/s with walker  target date: 07/01/21      PT LONG TERM GOAL #2   Title Willie Hernandez will be able to stand for >20'' in SLS stance, to show a significant improvement in balance in order to reduce fall risk  EVAL: not tested d/t pain  target date: 07/01/21      PT LONG TERM GOAL #3   Title Willie Hernandez will improve 30'' STS (MCID 2) to >/= 12x (w/ UE: N) to show improved LE strength and improved transfers  EVAL: Not tested d/t precautions  target date:  07/01/21      PT LONG TERM GOAL #4   Title Willie Hernandez will improve the following MMTs to >/= 4/5 to show improvement in strength:  hip abd, hip ext, knee ext  EVAL: not tested  target date: 07/01/21      PT LONG TERM GOAL #5   Title Willie Hernandez will improve FOTO score from 61 (on evaluation) to 79 as a proxy for functional improvement  target date: 07/01/21                   Plan - 05/20/21 1610     Clinical Impression Statement Pt reports no increase in baseline pain following therapy  HEP was reviewed, but left unchanged    Overall, Willie Hernandez is progressing very well with therapy.  Today we concentrated on lower extremity strengthening, hip strengthening, and normalizing gait.  Pt does very well with ambulation with SPC after short adjustment and mod cuing for sequencing and reducing step length.  No increase in pain with any activity today.  Pt will continue to benefit from skilled physical therapy to address remaining deficits and achieve listed goals.  Continue per POC.    Stability/Clinical Decision Making Stable/Uncomplicated    Rehab Potential Good    PT Frequency 2x / week    PT Duration 8 weeks    PT Treatment/Interventions ADLs/Self Care Home Management;Aquatic Therapy;Iontophoresis 4mg /ml Dexamethasone;Gait training;Therapeutic activities;Therapeutic exercise;Neuromuscular re-education;Manual techniques;Dry needling;Vasopneumatic Device;Spinal Manipulations    PT Next Visit Plan LE and UE MMT, progressive: balance, strengthening, return to activity    PT Home Exercise Plan [email or web address]    Consulted and Agree with Plan of Care Patient             Patient will benefit from skilled therapeutic intervention in order to improve the following deficits and impairments:  Abnormal gait, Decreased mobility, Decreased endurance, Difficulty walking, Decreased activity tolerance, Decreased strength, Impaired UE functional use, Pain  Visit  Diagnosis: Other abnormalities of gait and mobility  Muscle weakness  Balance problem     Problem List Patient Active Problem List   Diagnosis Date Noted   Multiple trauma    Acute blood loss anemia    Drug induced constipation    Trauma 04/02/2021   Fracture of multiple pubic rami, left, with delayed healing, subsequent encounter 04/02/2021  Status post total replacement of right hip 06/30/2018   Unilateral primary osteoarthritis, right hip 05/08/2018   Hx of adenomatous colonic polyps 07/14/2017   Rhegmatogenous retinal detachment of left eye 09/07/2016   Multiple retinal breaks of right eye 09/07/2016   GERD (gastroesophageal reflux disease) 02/20/2013    Mathis Dad, PT 05/20/2021, 4:11 PM  [facility] [address] [place], [place], [address] Phone: [phone]   Fax:  [phone]  Name: Willie Hernandez MRN: [id number] Date of Birth: 1955/11/30

## 2021-05-22 ENCOUNTER — Encounter: Payer: Self-pay | Admitting: Physical Therapy

## 2021-05-22 ENCOUNTER — Ambulatory Visit: Payer: 59 | Admitting: Physical Therapy

## 2021-05-22 ENCOUNTER — Other Ambulatory Visit: Payer: Self-pay

## 2021-05-22 DIAGNOSIS — R2689 Other abnormalities of gait and mobility: Secondary | ICD-10-CM

## 2021-05-22 DIAGNOSIS — M6281 Muscle weakness (generalized): Secondary | ICD-10-CM

## 2021-05-22 NOTE — Therapy (Signed)
[place], [place], [address] Phone: [phone]   Fax:  [phone]  Physical Therapy Treatment  Patient Details  Name: Willie Hernandez MRN: [id number] Date of Birth: 1956-03-04 Referring Provider (PT): Referred by: Courtney Heys, MD (04/09/2021)   Encounter Date: 05/22/2021   PT End of Session - 05/22/21 1303     Visit Number 5    Number of Visits 16    Date for PT Re-Evaluation 07/01/21    Authorization Type UHC    Authorization - Number of Visits 29    PT Start Time 2197    PT Stop Time 1343    PT Time Calculation (min) 40 min    Behavior During Therapy WFL for tasks assessed/performed             Past Medical History:  Diagnosis Date   Allergic rhinitis    GERD (gastroesophageal reflux disease)    Hallux rigidus    with heel cord tightness, Dr. Sharol Given   Hearing loss in right ear    noise exposure   Hx of adenomatous colonic polyps 07/14/2017   Mitral valve prolapse    mild mitral regurgitation   PONV (postoperative nausea and vomiting)    from Versed   Rhegmatogenous retinal detachment of left eye    Spermatocele    Right, Dr. Risa Grill    Past Surgical History:  Procedure Laterality Date   COLONOSCOPY  1997, 2006   corrective jaw surgery  2001   for overbite   eye lens replaced  05/27/2018   at [facility] eye center   GAS INSERTION Left 09/07/2016   Procedure: INSERTION OF GAS-C3F8;  Surgeon: Hayden Pedro, MD;  Location: [facility];  Service: Ophthalmology;  Laterality: Left;   LASER PHOTO ABLATION Bilateral 09/07/2016   Procedure: LASER PHOTO ABLATION RIGHT EYE;  Surgeon: Hayden Pedro, MD;  Location: [facility];  Service: Ophthalmology;  Laterality: Bilateral;   laser retinal tears OS  04/2012   SCLERAL BUCKLE WITH CRYO Left 09/07/2016   Procedure: SCLERAL BUCKLE WITH CRYO;  Surgeon: Hayden Pedro, MD;  Location: [facility];  Service: Ophthalmology;  Laterality: Left;   TOTAL HIP ARTHROPLASTY  Right 06/30/2018   Procedure: RIGHT TOTAL HIP ARTHROPLASTY ANTERIOR APPROACH;  Surgeon: Mcarthur Rossetti, MD;  [facility];  Service: Orthopedics;  Laterality: Right;   UPPER GASTROINTESTINAL ENDOSCOPY     WISDOM TOOTH EXTRACTION     age 65    There were no vitals filed for this visit.   Subjective Assessment - 05/22/21 1328     Subjective Pt reports he has been doing well using the cane, but still struggles with sequencing at times.  He reports 0/10 pain today.              PT Education - 05/22/21 1334     Education Details HEP            [facility] Adult PT Treatment/Exercise:   Therapeutic Exercise:   - nu-step L8 38m while taking subjective and planning session with patient - slant board stretch - 45''x2 - step up 6'' step - 2x10 lat and fwd L LE with no UE support - 2 up 1 down - heel raises on step - 2x15 - Standing L shoulder ER with RTB - 3x10 - Standing L shoulder IR with GTB - 3x10 - Sit to stand - x10   Neuromuscular re-ed, to improve balance and reduce fall risk: -  SLS,  L, 5x10''      PT Short Term Goals - 05/06/21 1801       PT SHORT TERM GOAL #1   Title Willie Hernandez will be >75% HEP compliant to improve carryover between sessions and facilitate independent management of condition    Target Date 05/27/21      PT SHORT TERM GOAL #2   Title --               PT Long Term Goals - 05/12/21 1843       PT LONG TERM GOAL #1   Title Willie Hernandez will improve 10 meter max gait speed to 1.2 m/s (.1 m/s MCID) to show functional improvement in ambulation  EVAL: .833 m/s with walker  target date: 07/01/21      PT LONG TERM GOAL #2   Title Willie Hernandez will be able to stand for >20'' in SLS stance, to show a significant improvement in balance in order to reduce fall risk  EVAL: not tested d/t pain  target date: 07/01/21      PT LONG TERM GOAL #3   Title Willie Hernandez will improve 30'' STS (MCID 2) to >/= 12x (w/ UE:  N) to show improved LE strength and improved transfers  EVAL: Not tested d/t precautions  target date: 07/01/21      PT LONG TERM GOAL #4   Title Willie Hernandez will improve the following MMTs to >/= 4/5 to show improvement in strength:  hip abd, hip ext, knee ext  EVAL: not tested  target date: 07/01/21      PT LONG TERM GOAL #5   Title Willie Hernandez will improve FOTO score from 61 (on evaluation) to 79 as a proxy for functional improvement  target date: 07/01/21                   Plan - 05/22/21 1335     Clinical Impression Statement Pt reports no increase in baseline pain following therapy  HEP was updated and reissued to patient; pt educated on HEP, was provided handout, and verbally confirmed understanding of exercises.    Overall, Willie Hernandez is progressing very well with therapy.  Today we concentrated on rotator cuff strengthening, periscapular strengthening, lower extremity strengthening, and balance/proprioception.  Pt progressing well with all facets of therapy with no increase in pain, but significant fatigue.  Pt will continue to benefit from skilled physical therapy to address remaining deficits and achieve listed goals.  Continue per POC.    Stability/Clinical Decision Making Stable/Uncomplicated    Rehab Potential Good    PT Frequency 2x / week    PT Duration 8 weeks    PT Treatment/Interventions ADLs/Self Care Home Management;Aquatic Therapy;Iontophoresis 4mg /ml Dexamethasone;Gait training;Therapeutic activities;Therapeutic exercise;Neuromuscular re-education;Manual techniques;Dry needling;Vasopneumatic Device;Spinal Manipulations    PT Next Visit Plan LE and UE MMT, progressive: balance, strengthening, return to activity    PT Home Exercise Plan [email or web address]    Consulted and Agree with Plan of Care Patient             Patient will benefit from skilled therapeutic intervention in order to improve the following deficits and impairments:   Abnormal gait, Decreased mobility, Decreased endurance, Difficulty walking, Decreased activity tolerance, Decreased strength, Impaired UE functional use, Pain  Visit Diagnosis: Other abnormalities of gait and mobility  Muscle weakness  Balance problem     Problem List Patient Active Problem List   Diagnosis Date Noted  Multiple trauma    Acute blood loss anemia    Drug induced constipation    Trauma 04/02/2021   Fracture of multiple pubic rami, left, with delayed healing, subsequent encounter 04/02/2021   Status post total replacement of right hip 06/30/2018   Unilateral primary osteoarthritis, right hip 05/08/2018   Hx of adenomatous colonic polyps 07/14/2017   Rhegmatogenous retinal detachment of left eye 09/07/2016   Multiple retinal breaks of right eye 09/07/2016   GERD (gastroesophageal reflux disease) 02/20/2013    Willie Hernandez, PT 05/22/2021, 1:42 PM  [facility] [address] [place], [place], [address] Phone: [phone]   Fax:  [phone]  Name: Willie Hernandez MRN: [id number] Date of Birth: Jul 09, 1956

## 2021-05-22 NOTE — Patient Instructions (Signed)
Access Code: [email or web address] URL: [email or web address] Date: 05/22/2021 Prepared by: Shearon Balo  Exercises Sidelying Hip Abduction - 1 x daily - 7 x weekly - 3 sets - 8 reps Single Leg Stance - 1 x daily - 6 x weekly - 1 sets - 3 reps - 30 hold Shoulder External Rotation with Anchored Resistance - 1 x daily - 7 x weekly - 3 sets - 10 reps Sit to Stand Without Arm Support - 1 x daily - 7 x weekly - 3 sets - 10 reps

## 2021-05-27 ENCOUNTER — Encounter: Payer: 59 | Admitting: Physical Therapy

## 2021-05-28 ENCOUNTER — Ambulatory Visit: Payer: 59 | Attending: Physical Medicine and Rehabilitation | Admitting: Physical Therapy

## 2021-05-28 ENCOUNTER — Encounter: Payer: Self-pay | Admitting: Physical Therapy

## 2021-05-28 ENCOUNTER — Other Ambulatory Visit: Payer: Self-pay

## 2021-05-28 DIAGNOSIS — R2689 Other abnormalities of gait and mobility: Secondary | ICD-10-CM | POA: Diagnosis present

## 2021-05-28 DIAGNOSIS — M6281 Muscle weakness (generalized): Secondary | ICD-10-CM | POA: Diagnosis present

## 2021-05-28 NOTE — Therapy (Signed)
[place], [place], [address] Phone: [phone]   Fax:  [phone]  Physical Therapy Treatment  Patient Details  Name: MCCLAIN SHALL MRN: [id number] Date of Birth: Nov 29, 1955 Referring Provider (PT): Referred by: Courtney Heys, MD (04/09/2021)   Encounter Date: 05/28/2021   PT End of Session - 05/28/21 1831     Visit Number 6    Number of Visits 16    Date for PT Re-Evaluation 07/01/21    Authorization Type UHC    Authorization - Number of Visits 72    PT Start Time 3474    PT Stop Time 1910    PT Time Calculation (min) 40 min    Behavior During Therapy WFL for tasks assessed/performed             Past Medical History:  Diagnosis Date   Allergic rhinitis    GERD (gastroesophageal reflux disease)    Hallux rigidus    with heel cord tightness, Dr. Sharol Given   Hearing loss in right ear    noise exposure   Hx of adenomatous colonic polyps 07/14/2017   Mitral valve prolapse    mild mitral regurgitation   PONV (postoperative nausea and vomiting)    from Versed   Rhegmatogenous retinal detachment of left eye    Spermatocele    Right, Dr. Risa Grill    Past Surgical History:  Procedure Laterality Date   COLONOSCOPY  1997, 2006   corrective jaw surgery  2001   for overbite   eye lens replaced  05/27/2018   at [facility]   GAS INSERTION Left 09/07/2016   Procedure: INSERTION OF GAS-C3F8;  Surgeon: Hayden Pedro, MD;  Location: [place];  Service: Ophthalmology;  Laterality: Left;   LASER PHOTO ABLATION Bilateral 09/07/2016   Procedure: LASER PHOTO ABLATION RIGHT EYE;  Surgeon: Hayden Pedro, MD;  Location: [place];  Service: Ophthalmology;  Laterality: Bilateral;   laser retinal tears OS  04/2012   SCLERAL BUCKLE WITH CRYO Left 09/07/2016   Procedure: SCLERAL BUCKLE WITH CRYO;  Surgeon: Hayden Pedro, MD;  Location: [place];  Service: Ophthalmology;  Laterality: Left;   TOTAL HIP ARTHROPLASTY  Right 06/30/2018   Procedure: RIGHT TOTAL HIP ARTHROPLASTY ANTERIOR APPROACH;  Surgeon: Mcarthur Rossetti, MD;  Location[facility];  Service: Orthopedics;  Laterality: Right;   UPPER GASTROINTESTINAL ENDOSCOPY     WISDOM TOOTH EXTRACTION     age 65    There were no vitals filed for this visit.   Subjective Assessment - 05/28/21 1835     Subjective Pt reports he is only using the cane at this point he has d/c'd the walker.  He reports 0/10 pain today.             [place] Adult PT Treatment/Exercise:   Therapeutic Exercise:   - nu-step L8 72m while taking subjective and planning session with patient - step up 6'' step - 2x10 lat and fwd L LE with no UE support - 2 up 1 down - heel raises on step - 2x15 (NT) - Standing L shoulder ER with GTB - 2x10 - Standing L shoulder IR with Blue TB - 2x10 - Row GTB - 3x10 - Sit to stand - 3x10 -10# - Wall squat - to 90 - 30x - Lateral walking with band around knees - RTB - 4 laps of 15' - ball on wall CC/CW - 2x20x ea  -scaption to 90 (next visit)  Neuromuscular re-ed, to improve balance and reduce fall risk: -  SLS, L, 4x15'' - Hurdle walking - 4 hurdles - 5 laps - reciprocal     PT Short Term Goals - 05/28/21 1838       PT SHORT TERM GOAL #1   Title Adriana Reams will be >75% HEP compliant to improve carryover between sessions and facilitate independent management of condition    Target Date 05/27/21               PT Long Term Goals - 05/12/21 1843       PT LONG TERM GOAL #1   Title Adriana Reams will improve 10 meter max gait speed to 1.2 m/s (.1 m/s MCID) to show functional improvement in ambulation  EVAL: .833 m/s with walker  target date: 07/01/21      PT LONG TERM GOAL #2   Title Adriana Reams will be able to stand for >20'' in SLS stance, to show a significant improvement in balance in order to reduce fall risk  EVAL: not tested d/t pain  target date: 07/01/21      PT LONG TERM GOAL #3    Title Adriana Reams will improve 30'' STS (MCID 2) to >/= 12x (w/ UE: N) to show improved LE strength and improved transfers  EVAL: Not tested d/t precautions  target date: 07/01/21      PT LONG TERM GOAL #4   Title Adriana Reams will improve the following MMTs to >/= 4/5 to show improvement in strength:  hip abd, hip ext, knee ext  EVAL: not tested  target date: 07/01/21      PT LONG TERM GOAL #5   Title Adriana Reams will improve FOTO score from 61 (on evaluation) to 79 as a proxy for functional improvement  target date: 07/01/21                   Plan - 05/29/21 0919     Clinical Impression Statement Pt reports no increase in baseline pain following therapy.  Overall, BENJAMEN KOELLING is progressing very well with therapy.  Today we concentrated on rotator cuff strengthening, lower extremity strengthening, and balance/proprioception.  Pt continues to progress in as/better than expected with increased strength and balance with no reports of pain.  We worked on walking without cane today and I suggested he could begin weaning from the cane at home.  Pt will continue to benefit from skilled physical therapy to address remaining deficits and achieve listed goals.  Continue per POC.    Stability/Clinical Decision Making Stable/Uncomplicated    Rehab Potential Good    PT Frequency 2x / week    PT Duration 8 weeks    PT Treatment/Interventions ADLs/Self Care Home Management;Aquatic Therapy;Iontophoresis 4mg /ml Dexamethasone;Gait training;Therapeutic activities;Therapeutic exercise;Neuromuscular re-education;Manual techniques;Dry needling;Vasopneumatic Device;Spinal Manipulations    PT Next Visit Plan LE and UE MMT, progressive: balance, strengthening, return to activity    PT Home Exercise Plan [id number]    Consulted and Agree with Plan of Care Patient             Patient will benefit from skilled therapeutic intervention in order to improve the following deficits  and impairments:  Abnormal gait, Decreased mobility, Decreased endurance, Difficulty walking, Decreased activity tolerance, Decreased strength, Impaired UE functional use, Pain  Visit Diagnosis: Other abnormalities of gait and mobility  Muscle weakness  Balance problem     Problem List Patient Active Problem List  Diagnosis Date Noted   Multiple trauma    Acute blood loss anemia    Drug induced constipation    Trauma 04/02/2021   Fracture of multiple pubic rami, left, with delayed healing, subsequent encounter 04/02/2021   Status post total replacement of right hip 06/30/2018   Unilateral primary osteoarthritis, right hip 05/08/2018   Hx of adenomatous colonic polyps 07/14/2017   Rhegmatogenous retinal detachment of left eye 09/07/2016   Multiple retinal breaks of right eye 09/07/2016   GERD (gastroesophageal reflux disease) 02/20/2013    Mathis Dad, PT 05/29/2021, 9:20 AM  [facility] [address] [place], [place], [address] Phone: [phone]   Fax:  [phone]  Name: NICKY KRAS MRN: [id number] Date of Birth: October 07, 1955

## 2021-06-03 ENCOUNTER — Ambulatory Visit: Payer: 59 | Admitting: Physical Therapy

## 2021-06-03 ENCOUNTER — Encounter: Payer: Self-pay | Admitting: Physician Assistant

## 2021-06-03 ENCOUNTER — Ambulatory Visit: Payer: Self-pay

## 2021-06-03 ENCOUNTER — Encounter: Payer: Self-pay | Admitting: Physical Therapy

## 2021-06-03 ENCOUNTER — Ambulatory Visit (INDEPENDENT_AMBULATORY_CARE_PROVIDER_SITE_OTHER): Payer: 59 | Admitting: Physician Assistant

## 2021-06-03 ENCOUNTER — Ambulatory Visit (INDEPENDENT_AMBULATORY_CARE_PROVIDER_SITE_OTHER): Payer: 59

## 2021-06-03 ENCOUNTER — Other Ambulatory Visit: Payer: Self-pay

## 2021-06-03 DIAGNOSIS — R2689 Other abnormalities of gait and mobility: Secondary | ICD-10-CM | POA: Diagnosis not present

## 2021-06-03 DIAGNOSIS — M6281 Muscle weakness (generalized): Secondary | ICD-10-CM

## 2021-06-03 DIAGNOSIS — S3210XD Unspecified fracture of sacrum, subsequent encounter for fracture with routine healing: Secondary | ICD-10-CM

## 2021-06-03 DIAGNOSIS — S42022D Displaced fracture of shaft of left clavicle, subsequent encounter for fracture with routine healing: Secondary | ICD-10-CM

## 2021-06-03 NOTE — Progress Notes (Signed)
Office Visit Note   Patient: Willie Hernandez           Date of Birth: 11/29/1955           MRN: [id number] Visit Date: 06/03/2021              Requested by: Lavone Orn, MD [facility] [employer] [place] [address],  [place] [address] PCP: Lavone Orn, MD   Assessment & Plan: Visit Diagnoses:  1. Closed fracture of sacrum with routine healing, unspecified portion of sacrum, subsequent encounter   2. Closed displaced fracture of shaft of left clavicle with routine healing, subsequent encounter     Plan: Recommend no high-impact activities for the next 3 months.  We will continue work with therapy for gait balance and strengthening of the lower legs.  Follow-up with [place] on an as-needed basis if he has any questions or concerns.  Follow-Up Instructions: Return if symptoms worsen or fail to improve.   Orders:  Orders Placed This Encounter  Procedures   XR Pelvis 1-2 Views   XR Clavicle Left   No orders of the defined types were placed in this encounter.     Procedures: No procedures performed   Clinical Data: No additional findings.   Subjective: Chief Complaint  Patient presents with   Pelvis - Follow-up    HPI Mr. Flett comes in today for follow-up of his left clavicle and pelvic fractures.  He is now approximately 10 weeks status post open reduction internal fixation left clavicle fracture and SI joint fixation.  He is also being followed for left rami fractures.  States he has no pain in the left shoulder and has good range of motion.  He continues to work with therapy for gait balance strengthening of his lower extremities particularly his quads of his left leg.  Is having no pain in the left hip or in the pelvis.  Review of Systems Negative or noncontributory.  Objective: Vital Signs: There were no vitals taken for this visit.  Physical Exam General well-developed well-nourished male who ambulates with a cane with nonantalgic gait. Ortho Exam Left hip  excellent range of motion without pain. Left shoulder full overhead activity full internal and external rotation full overhead activity. Specialty Comments:  No specialty comments available.  Imaging: XR Clavicle Left  Result Date: 06/03/2021 Left clavicle 2 views: No hardware failure.  No change in overall position alignment.  Fracture appears well-healed.  Shoulders well located.  XR Pelvis 1-2 Views  Result Date: 06/03/2021 AP pelvis: SI joint hardware is unchanged in overall position alignment.  Rami fractures appear well-healed.  Both hips well located    PMFS History: Patient Active Problem List   Diagnosis Date Noted   Multiple trauma    Acute blood loss anemia    Drug induced constipation    Trauma 04/02/2021   Fracture of multiple pubic rami, left, with delayed healing, subsequent encounter 04/02/2021   Status post total replacement of right hip 06/30/2018   Unilateral primary osteoarthritis, right hip 05/08/2018   Hx of adenomatous colonic polyps 07/14/2017   Rhegmatogenous retinal detachment of left eye 09/07/2016   Multiple retinal breaks of right eye 09/07/2016   GERD (gastroesophageal reflux disease) 02/20/2013   Past Medical History:  Diagnosis Date   Allergic rhinitis    GERD (gastroesophageal reflux disease)    Hallux rigidus    with heel cord tightness, Dr. Sharol Given   Hearing loss in right ear    noise exposure  Hx of adenomatous colonic polyps 07/14/2017   Mitral valve prolapse    mild mitral regurgitation   PONV (postoperative nausea and vomiting)    from Versed   Rhegmatogenous retinal detachment of left eye    Spermatocele    Right, Dr. Risa Grill    Family History  Problem Relation Age of Onset   Atrial fibrillation Father    Sudden death Father    Congestive Heart Failure Mother    Alzheimer's disease Mother    Esophageal cancer Unknown        uncle   Colon cancer Paternal Grandfather    Colon polyps Neg Hx    Rectal cancer Neg Hx    Stomach  cancer Neg Hx     Past Surgical History:  Procedure Laterality Date   COLONOSCOPY  1997, 2006   corrective jaw surgery  2001   for overbite   eye lens replaced  05/27/2018   at [facility] center   GAS INSERTION Left 09/07/2016   Procedure: INSERTION OF GAS-C3F8;  Surgeon: Hayden Pedro, MD;  Location: [facility];  Service: Ophthalmology;  Laterality: Left;   LASER PHOTO ABLATION Bilateral 09/07/2016   Procedure: LASER PHOTO ABLATION RIGHT EYE;  Surgeon: Hayden Pedro, MD;  Location: [facility];  Service: Ophthalmology;  Laterality: Bilateral;   laser retinal tears OS  04/2012   SCLERAL BUCKLE WITH CRYO Left 09/07/2016   Procedure: SCLERAL BUCKLE WITH CRYO;  Surgeon: Hayden Pedro, MD;  Location: [place];  Service: Ophthalmology;  Laterality: Left;   TOTAL HIP ARTHROPLASTY Right 06/30/2018   Procedure: RIGHT TOTAL HIP ARTHROPLASTY ANTERIOR APPROACH;  Surgeon: Mcarthur Rossetti, MD;  [facility];  Service: Orthopedics;  Laterality: Right;   UPPER GASTROINTESTINAL ENDOSCOPY     WISDOM TOOTH EXTRACTION     age 47   Social History   Occupational History   Occupation: Forensic psychologist for [employer].    Employer: [employer]   Tobacco Use   Smoking status: Never   Smokeless tobacco: Never  Vaping Use   Vaping Use: Never used  Substance and Sexual Activity   Alcohol use: Yes    Comment: occasional   Drug use: No   Sexual activity: Not on file

## 2021-06-03 NOTE — Therapy (Signed)
[place], [place], [address] Phone: [phone]   Fax:  [phone]  Physical Therapy Treatment  Patient Details  Name: Willie Hernandez MRN: [id number] Date of Birth: [birth year] Referring Provider (PT): Referred by: Courtney Heys, MD (04/09/2021)   Encounter Date: 06/03/2021   PT End of Session - 06/03/21 1308     Visit Number 7    Number of Visits 16    Date for PT Re-Evaluation 07/01/21    Authorization Type UHC    Authorization - Number of Visits 4    PT Start Time 1093    PT Stop Time 1345    PT Time Calculation (min) 38 min    Behavior During Therapy WFL for tasks assessed/performed             Past Medical History:  Diagnosis Date   Allergic rhinitis    GERD (gastroesophageal reflux disease)    Hallux rigidus    with heel cord tightness, Dr. Sharol Given   Hearing loss in right ear    noise exposure   Hx of adenomatous colonic polyps 07/14/2017   Mitral valve prolapse    mild mitral regurgitation   PONV (postoperative nausea and vomiting)    from Versed   Rhegmatogenous retinal detachment of left eye    Spermatocele    Right, Dr. Risa Grill    Past Surgical History:  Procedure Laterality Date   COLONOSCOPY  1997, 2006   corrective jaw surgery  2001   for overbite   eye lens replaced  05/27/2018   at [facility] center   GAS INSERTION Left 09/07/2016   Procedure: INSERTION OF GAS-C3F8;  Surgeon: Hayden Pedro, MD;  Location: [facility] [place];  Service: Ophthalmology;  Laterality: Left;   LASER PHOTO ABLATION Bilateral 09/07/2016   Procedure: LASER PHOTO ABLATION RIGHT EYE;  Surgeon: Hayden Pedro, MD;  Location: [facility];  Service: Ophthalmology;  Laterality: Bilateral;   laser retinal tears OS  04/2012   SCLERAL BUCKLE WITH CRYO Left 09/07/2016   Procedure: SCLERAL BUCKLE WITH CRYO;  Surgeon: Hayden Pedro, MD;  Location: [facility];  Service: Ophthalmology;  Laterality: Left;   TOTAL HIP ARTHROPLASTY  Right 06/30/2018   Procedure: RIGHT TOTAL HIP ARTHROPLASTY ANTERIOR APPROACH;  Surgeon: Mcarthur Rossetti, MD;  [facility];  Service: Orthopedics;  Laterality: Right;   UPPER GASTROINTESTINAL ENDOSCOPY     WISDOM TOOTH EXTRACTION     age 65    There were no vitals filed for this visit.   Subjective Assessment - 06/03/21 1313     Subjective Pt reports that overall he is doing well.  He has not had to use his cane around the house.  He has some muscle soreness after working out.  He reports 0/10 pain today.             [place] Adult PT Treatment/Exercise:   Therapeutic Exercise:   - bike - L5 - 32m- step up 6'' step - 2x10 lat and fwd L LE with no UE support - 2 up 1 down - heel raises on step - 2x15 (NT) - Standing L shoulder ER with GTB at 45 - 2x10 - Sit to stand - 3x10 -10# (NT) - Wall squat - to 90 - 90'' - hip abduction with RTB around knees - light UE support 2x20 ea - ball on wall CC/CW - 2x20x ea (NT) - scaption to 90 - 3#   Neuromuscular re-ed, to improve  balance and reduce fall risk: -  SLS, L, 4x15'' - Hurdle walking - 4 hurdles - 4 laps (2 ea lat and fwd)   PT Short Term Goals - 06/03/21 1346       PT SHORT TERM GOAL #1   Title Willie Hernandez will be >75% HEP compliant to improve carryover between sessions and facilitate independent management of condition    Baseline 11/9: MET    Target Date 05/27/21               PT Long Term Goals - 05/12/21 1843       PT LONG TERM GOAL #1   Title Willie Hernandez will improve 10 meter max gait speed to 1.2 m/s (.1 m/s MCID) to show functional improvement in ambulation  EVAL: .833 m/s with walker  target date: 07/01/21      PT LONG TERM GOAL #2   Title Willie Hernandez will be able to stand for >20'' in SLS stance, to show a significant improvement in balance in order to reduce fall risk  EVAL: not tested d/t pain  target date: 07/01/21      PT LONG TERM GOAL #3   Title Willie Hernandez  will improve 30'' STS (MCID 2) to >/= 12x (w/ UE: N) to show improved LE strength and improved transfers  EVAL: Not tested d/t precautions  target date: 07/01/21      PT LONG TERM GOAL #4   Title Willie Hernandez will improve the following MMTs to >/= 4/5 to show improvement in strength:  hip abd, hip ext, knee ext  EVAL: not tested  target date: 07/01/21      PT LONG TERM GOAL #5   Title Willie Hernandez will improve FOTO score from 61 (on evaluation) to 79 as a proxy for functional improvement  target date: 07/01/21                   Plan - 06/03/21 1348     Clinical Impression Statement Overall, Willie Hernandez is progressing well with therapy.  Pt reports no increase in baseline pain following therapy.  Today we concentrated on lower extremity strengthening, hip strengthening, and balance/proprioception.  Pt continues to progress very well with strength and balance.  Able to progress to SLS on unstable surface.  He is challenged with this, but is able to complete with good form.  Able to reach 74'' with wall squat to 90 showing excellent quad endurace.  Pt will continue to benefit from skilled physical therapy to address remaining deficits and achieve listed goals.  Continue per POC.    Stability/Clinical Decision Making Stable/Uncomplicated    Rehab Potential Good    PT Frequency 2x / week    PT Duration 8 weeks    PT Treatment/Interventions ADLs/Self Care Home Management;Aquatic Therapy;Iontophoresis 63m/ml Dexamethasone;Gait training;Therapeutic activities;Therapeutic exercise;Neuromuscular re-education;Manual techniques;Dry needling;Vasopneumatic Device;Spinal Manipulations    PT Next Visit Plan LE and UE MMT, progressive: balance, strengthening, return to activity    PT Home Exercise Plan [id number]    Consulted and Agree with Plan of Care Patient             Patient will benefit from skilled therapeutic intervention in order to improve the following deficits and  impairments:  Abnormal gait, Decreased mobility, Decreased endurance, Difficulty walking, Decreased activity tolerance, Decreased strength, Impaired UE functional use, Pain  Visit Diagnosis: Other abnormalities of gait and mobility  Muscle weakness  Balance problem  Problem List Patient Active Problem List   Diagnosis Date Noted   Multiple trauma    Acute blood loss anemia    Drug induced constipation    Trauma 04/02/2021   Fracture of multiple pubic rami, left, with delayed healing, subsequent encounter 04/02/2021   Status post total replacement of right hip 06/30/2018   Unilateral primary osteoarthritis, right hip 05/08/2018   Hx of adenomatous colonic polyps 07/14/2017   Rhegmatogenous retinal detachment of left eye 09/07/2016   Multiple retinal breaks of right eye 09/07/2016   GERD (gastroesophageal reflux disease) 02/20/2013    Mathis Dad, PT 06/03/2021, 1:48 PM  [facility] [address] [place], [place], [address] Phone: [phone]   Fax:  [phone]  Name: Willie Hernandez MRN: [id number] Date of Birth: 10/18/1955

## 2021-06-05 ENCOUNTER — Encounter: Payer: Self-pay | Admitting: Physical Therapy

## 2021-06-05 ENCOUNTER — Telehealth: Payer: Self-pay | Admitting: Orthopaedic Surgery

## 2021-06-05 ENCOUNTER — Ambulatory Visit: Payer: 59 | Admitting: Physical Therapy

## 2021-06-05 ENCOUNTER — Other Ambulatory Visit: Payer: Self-pay

## 2021-06-05 DIAGNOSIS — R2689 Other abnormalities of gait and mobility: Secondary | ICD-10-CM

## 2021-06-05 DIAGNOSIS — M6281 Muscle weakness (generalized): Secondary | ICD-10-CM

## 2021-06-05 NOTE — Therapy (Signed)
[place], [place], [address] Phone: [phone]   Fax:  [phone]  Physical Therapy Treatment  Patient Details  Name: Willie Hernandez MRN: [id number] Date of Birth: [birth year] Referring Provider (PT): Referred by: Courtney Heys, MD (04/09/2021)   Encounter Date: 06/05/2021   PT End of Session - 06/05/21 1306     Visit Number 8    Number of Visits 16    Date for PT Re-Evaluation 07/01/21    Authorization Type UHC    Authorization - Number of Visits 61    PT Start Time 2458    PT Stop Time 1345    PT Time Calculation (min) 40 min    Behavior During Therapy WFL for tasks assessed/performed             Past Medical History:  Diagnosis Date   Allergic rhinitis    GERD (gastroesophageal reflux disease)    Hallux rigidus    with heel cord tightness, Dr. Sharol Given   Hearing loss in right ear    noise exposure   Hx of adenomatous colonic polyps 07/14/2017   Mitral valve prolapse    mild mitral regurgitation   PONV (postoperative nausea and vomiting)    from Versed   Rhegmatogenous retinal detachment of left eye    Spermatocele    Right, Dr. Risa Grill    Past Surgical History:  Procedure Laterality Date   COLONOSCOPY  1997, 2006   corrective jaw surgery  2001   for overbite   eye lens replaced  05/27/2018   at [facility]   GAS INSERTION Left 09/07/2016   Procedure: INSERTION OF GAS-C3F8;  Surgeon: Hayden Pedro, MD;  Location: [place];  Service: Ophthalmology;  Laterality: Left;   LASER PHOTO ABLATION Bilateral 09/07/2016   Procedure: LASER PHOTO ABLATION RIGHT EYE;  Surgeon: Hayden Pedro, MD;  Location: [place];  Service: Ophthalmology;  Laterality: Bilateral;   laser retinal tears OS  04/2012   SCLERAL BUCKLE WITH CRYO Left 09/07/2016   Procedure: SCLERAL BUCKLE WITH CRYO;  Surgeon: Hayden Pedro, MD;  Location: [place];  Service: Ophthalmology;  Laterality: Left;   TOTAL HIP ARTHROPLASTY  Right 06/30/2018   Procedure: RIGHT TOTAL HIP ARTHROPLASTY ANTERIOR APPROACH;  Surgeon: Mcarthur Rossetti, MD;  Location: [facility];  Service: Orthopedics;  Laterality: Right;   UPPER GASTROINTESTINAL ENDOSCOPY     WISDOM TOOTH EXTRACTION     age 65    There were no vitals filed for this visit.   Subjective Assessment - 06/05/21 1310     Subjective Pt returned to [place] and was released.  He is feeling good today.  He has been HEP compliant.  He reports 0/10 pain today.             [facility] Adult PT Treatment/Exercise:   Therapeutic Exercise:   - bike - L6 - 50m- step up 8'' step - 3x10 fwd (with march) L LE with no UE support - L S/L heel raise - 2x10  - Standing L shoulder ER with GTB at 45 - 3x10 - Face pulls - 3x10 YTB - Sit to stand - 3x10 -10# - steamboats with RTB around knees - light UE support x20 ea - scaption YTB - 3x10 - abduction YTB - 3x10 - SA punch blue TB - 3x10   Neuromuscular re-ed, to improve balance and reduce fall risk: -  SLS, L, 30'' x2     PT Short  Term Goals - 06/03/21 1346       PT SHORT TERM GOAL #1   Title Willie Hernandez will be >75% HEP compliant to improve carryover between sessions and facilitate independent management of condition    Baseline 11/9: MET    Target Date 05/27/21               PT Long Term Goals - 05/12/21 1843       PT LONG TERM GOAL #1   Title Willie Hernandez will improve 10 meter max gait speed to 1.2 m/s (.1 m/s MCID) to show functional improvement in ambulation  EVAL: .833 m/s with walker  target date: 07/01/21      PT LONG TERM GOAL #2   Title Willie Hernandez will be able to stand for >20'' in SLS stance, to show a significant improvement in balance in order to reduce fall risk  EVAL: not tested d/t pain  target date: 07/01/21      PT LONG TERM GOAL #3   Title Willie Hernandez will improve 30'' STS (MCID 2) to >/= 12x (w/ UE: N) to show improved LE strength and improved transfers  EVAL: Not  tested d/t precautions  target date: 07/01/21      PT LONG TERM GOAL #4   Title Willie Hernandez will improve the following MMTs to >/= 4/5 to show improvement in strength:  hip abd, hip ext, knee ext  EVAL: not tested  target date: 07/01/21      PT LONG TERM GOAL #5   Title Willie Hernandez will improve FOTO score from 61 (on evaluation) to 79 as a proxy for functional improvement  target date: 07/01/21                   Plan - 06/05/21 1402     Clinical Impression Statement Overall, Willie Hernandez is progressing very well with therapy.  Pt reports no increase in baseline pain following therapy.  Today we concentrated on rotator cuff strengthening and hip strengthening.  Pt continues to progress as expected.  He is able to maintain SLS with minimal UE support.  He still fatigues with hip stabilization exercises, but this is improving.  Pt will continue to benefit from skilled physical therapy to address remaining deficits and achieve listed goals.  Continue per POC.    Stability/Clinical Decision Making Stable/Uncomplicated    Rehab Potential Good    PT Frequency 2x / week    PT Duration 8 weeks    PT Treatment/Interventions ADLs/Self Care Home Management;Aquatic Therapy;Iontophoresis 3m/ml Dexamethasone;Gait training;Therapeutic activities;Therapeutic exercise;Neuromuscular re-education;Manual techniques;Dry needling;Vasopneumatic Device;Spinal Manipulations    PT Next Visit Plan LE and UE MMT, progressive: balance, strengthening, return to activity    PT Home Exercise Plan [id number]    Consulted and Agree with Plan of Care Patient             Patient will benefit from skilled therapeutic intervention in order to improve the following deficits and impairments:  Abnormal gait, Decreased mobility, Decreased endurance, Difficulty walking, Decreased activity tolerance, Decreased strength, Impaired UE functional use, Pain  Visit Diagnosis: Other abnormalities of gait and  mobility  Muscle weakness  Balance problem     Problem List Patient Active Problem List   Diagnosis Date Noted   Multiple trauma    Acute blood loss anemia    Drug induced constipation    Trauma 04/02/2021   Fracture of multiple pubic rami, left, with delayed healing, subsequent encounter  04/02/2021   Status post total replacement of right hip 06/30/2018   Unilateral primary osteoarthritis, right hip 05/08/2018   Hx of adenomatous colonic polyps 07/14/2017   Rhegmatogenous retinal detachment of left eye 09/07/2016   Multiple retinal breaks of right eye 09/07/2016   GERD (gastroesophageal reflux disease) 02/20/2013    Willie Hernandez, PT 06/05/2021, 2:04 PM  [place] [address] [place], [place], [address] Phone: [phone]   Fax:  [phone]  Name: Willie Hernandez MRN: [id number] Date of Birth: [birth year]

## 2021-06-05 NOTE — Telephone Encounter (Signed)
Pt asking for a release to work note since [facility] released him yesterday to only follow up as needed. Pt said it would be okay to post it to my chart. The best call back number as needed is [phone].

## 2021-06-05 NOTE — Telephone Encounter (Signed)
Note entered and available in My Chart.

## 2021-06-05 NOTE — Telephone Encounter (Signed)
Ok for note 

## 2021-06-09 ENCOUNTER — Other Ambulatory Visit: Payer: Self-pay | Admitting: Internal Medicine

## 2021-06-09 ENCOUNTER — Other Ambulatory Visit: Payer: Self-pay | Admitting: Family Medicine

## 2021-06-09 DIAGNOSIS — I2510 Atherosclerotic heart disease of native coronary artery without angina pectoris: Secondary | ICD-10-CM

## 2021-06-12 ENCOUNTER — Encounter: Payer: 59 | Admitting: Physical Therapy

## 2021-06-17 ENCOUNTER — Encounter: Payer: Self-pay | Admitting: Physical Therapy

## 2021-06-17 ENCOUNTER — Ambulatory Visit: Payer: 59 | Admitting: Physical Therapy

## 2021-06-17 ENCOUNTER — Other Ambulatory Visit: Payer: Self-pay

## 2021-06-17 DIAGNOSIS — R2689 Other abnormalities of gait and mobility: Secondary | ICD-10-CM

## 2021-06-17 DIAGNOSIS — M6281 Muscle weakness (generalized): Secondary | ICD-10-CM

## 2021-06-17 NOTE — Therapy (Addendum)
[place], [place], [address] Phone: [phone]   Fax:  [phone]  PHYSICAL THERAPY UNPLANNED DISCHARGE SUMMARY   Visits from Start of Care: 9  Current functional level related to goals / functional outcomes: Current status unknown   Remaining deficits: Current status unknown   Education / Equipment: Pt has not returned since visit listed below  Patient goals were not assessed. Patient is being discharged due to not returning since the last visit.  (the below note was addended to include the above D/C summary on 07/01/21)   Physical Therapy Treatment  Patient Details  Name: Willie Hernandez MRN: [id number] Date of Birth: 1956-01-28 Referring Provider (PT): Referred by: Courtney Heys, MD (04/09/2021)   Encounter Date: 06/17/2021   PT End of Session - 06/17/21 1403     Visit Number 9    Number of Visits 16    Date for PT Re-Evaluation 07/01/21    Authorization Type UHC    Authorization - Number of Visits 11    PT Start Time 2297    PT Stop Time 1443    PT Time Calculation (min) 41 min    Behavior During Therapy WFL for tasks assessed/performed             Past Medical History:  Diagnosis Date   Allergic rhinitis    GERD (gastroesophageal reflux disease)    Hallux rigidus    with heel cord tightness, Dr. Sharol Given   Hearing loss in right ear    noise exposure   Hx of adenomatous colonic polyps 07/14/2017   Mitral valve prolapse    mild mitral regurgitation   PONV (postoperative nausea and vomiting)    from Versed   Rhegmatogenous retinal detachment of left eye    Spermatocele    Right, Dr. Risa Grill    Past Surgical History:  Procedure Laterality Date   COLONOSCOPY  1997, 2006   corrective jaw surgery  2001   for overbite   eye lens replaced  05/27/2018   at [facility] center   GAS INSERTION Left 09/07/2016   Procedure: INSERTION OF GAS-C3F8;  Surgeon: Hayden Pedro, MD;   Location: [facility];  Service: Ophthalmology;  Laterality: Left;   LASER PHOTO ABLATION Bilateral 09/07/2016   Procedure: LASER PHOTO ABLATION RIGHT EYE;  Surgeon: Hayden Pedro, MD;  Location: [place];  Service: Ophthalmology;  Laterality: Bilateral;   laser retinal tears OS  04/2012   SCLERAL BUCKLE WITH CRYO Left 09/07/2016   Procedure: SCLERAL BUCKLE WITH CRYO;  Surgeon: Hayden Pedro, MD;  Location: [place];  Service: Ophthalmology;  Laterality: Left;   TOTAL HIP ARTHROPLASTY Right 06/30/2018   Procedure: RIGHT TOTAL HIP ARTHROPLASTY ANTERIOR APPROACH;  Surgeon: Mcarthur Rossetti, MD;  Location: [facility];  Service: Orthopedics;  Laterality: Right;   UPPER GASTROINTESTINAL ENDOSCOPY     WISDOM TOOTH EXTRACTION     age 65    There were no vitals filed for this visit.   Subjective Assessment - 06/17/21 1408     Subjective Pt reports that he has not been able to do his exercises because he has been traveling.  He feels things are going well overall.  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  0/10 pain currently             [facility] Adult PT Treatment/Exercise:   Therapeutic Exercise:   - bike - L6 -  52m- step up 8'' step - 2x10 fwd and lat (with march) L LE with no UE support holding 10# contralateral UE - L S/L heel raise - 2x10  - Standing L shoulder ER with GTB at 45 - 3x10 (NT) - Face pulls - 3x10 Green TB - Sit to stand - 3x10 -10# (NT) - RDL - 10x (L) - T and Y on bolster - 3x10 ea - lateral external rotation wall walks - RTB - 8x   Neuromuscular re-ed, to improve balance and reduce fall risk: -  SLS, L, 45'' x3 - standing on bosu (black) with and without perturbations    PT Short Term Goals - 06/03/21 1346       PT SHORT TERM GOAL #1   Title Willie Reamswill be >75% HEP compliant to improve carryover between sessions and facilitate independent management of condition    Baseline 11/9: MET    Target Date 05/27/21                PT Long Term Goals - 05/12/21 1843       PT LONG TERM GOAL #1   Title Willie Reamswill improve 10 meter max gait speed to 1.2 m/s (.1 m/s MCID) to show functional improvement in ambulation  EVAL: .833 m/s with walker  target date: 07/01/21      PT LONG TERM GOAL #2   Title Willie Reamswill be able to stand for >20'' in SLS stance, to show a significant improvement in balance in order to reduce fall risk  EVAL: not tested d/t pain  target date: 07/01/21      PT LONG TERM GOAL #3   Title Willie Reamswill improve 30'' STS (MCID 2) to >/= 12x (w/ UE: N) to show improved LE strength and improved transfers  EVAL: Not tested d/t precautions  target date: 07/01/21      PT LONG TERM GOAL #4   Title Willie Reamswill improve the following MMTs to >/= 4/5 to show improvement in strength:  hip abd, hip ext, knee ext  EVAL: not tested  target date: 07/01/21      PT LONG TERM GOAL #5   Title Willie Reamswill improve FOTO score from 61 (on evaluation) to 79 as a proxy for functional improvement  target date: 07/01/21                   Plan - 06/17/21 1532     Clinical Impression Statement Overall, Willie Hernandez progressing very well with therapy.  Pt reports no increase in baseline pain following therapy.  Today we concentrated on rotator cuff strengthening, periscapular strengthening, and hip strengthening.  Pt continues to show progress with strength exercises.  He is challenged by dynamic SL activities but is able to complete with good form.  Pt will continue to benefit from skilled physical therapy to address remaining deficits and achieve listed goals.  Continue per POC.    Stability/Clinical Decision Making Stable/Uncomplicated    Rehab Potential Good    PT Frequency 2x / week    PT Duration 8 weeks    PT Treatment/Interventions ADLs/Self Care Home Management;Aquatic Therapy;Iontophoresis 456mml Dexamethasone;Gait  training;Therapeutic activities;Therapeutic exercise;Neuromuscular re-education;Manual techniques;Dry needling;Vasopneumatic Device;Spinal Manipulations    PT Next Visit Plan LE and UE MMT, progressive: balance, strengthening, return to activity    PT Home Exercise Plan [id number]    Consulted and Agree with Plan of Care Patient  Patient will benefit from skilled therapeutic intervention in order to improve the following deficits and impairments:  Abnormal gait, Decreased mobility, Decreased endurance, Difficulty walking, Decreased activity tolerance, Decreased strength, Impaired UE functional use, Pain  Visit Diagnosis: Other abnormalities of gait and mobility  Muscle weakness  Balance problem     Problem List Patient Active Problem List   Diagnosis Date Noted   Multiple trauma    Acute blood loss anemia    Drug induced constipation    Trauma 04/02/2021   Fracture of multiple pubic rami, left, with delayed healing, subsequent encounter 04/02/2021   Status post total replacement of right hip 06/30/2018   Unilateral primary osteoarthritis, right hip 05/08/2018   Hx of adenomatous colonic polyps 07/14/2017   Rhegmatogenous retinal detachment of left eye 09/07/2016   Multiple retinal breaks of right eye 09/07/2016   GERD (gastroesophageal reflux disease) 02/20/2013    Mathis Dad, PT 06/17/2021, 3:32 PM  [facility] [address] [place], [place], [address] Phone: [phone]   Fax:  [phone]  Name: Willie Hernandez MRN: [id number] Date of Birth: 1956/01/19

## 2021-06-22 ENCOUNTER — Inpatient Hospital Stay: Admission: RE | Admit: 2021-06-22 | Payer: 59 | Source: Ambulatory Visit

## 2021-06-24 ENCOUNTER — Ambulatory Visit: Payer: 59 | Admitting: Physical Therapy

## 2021-06-26 ENCOUNTER — Encounter: Payer: 59 | Admitting: Physical Therapy

## 2021-06-29 NOTE — Telephone Encounter (Signed)
Please advise 

## 2021-06-30 NOTE — Telephone Encounter (Signed)
Anything specific in therapy he needs to continue?

## 2021-07-01 ENCOUNTER — Ambulatory Visit: Payer: 59 | Admitting: Physical Therapy

## 2021-07-02 ENCOUNTER — Other Ambulatory Visit: Payer: 59

## 2021-07-08 ENCOUNTER — Ambulatory Visit: Payer: 59 | Admitting: Physical Therapy

## 2021-07-10 ENCOUNTER — Encounter: Payer: 59 | Admitting: Physical Therapy

## 2021-07-15 ENCOUNTER — Ambulatory Visit: Payer: 59 | Admitting: Physical Therapy

## 2021-07-17 ENCOUNTER — Encounter: Payer: 59 | Admitting: Physical Therapy

## 2021-07-29 ENCOUNTER — Ambulatory Visit
Admission: RE | Admit: 2021-07-29 | Discharge: 2021-07-29 | Disposition: A | Discharge status: Home / Self Care | Payer: 59 | Source: Ambulatory Visit | Attending: Internal Medicine | Admitting: Internal Medicine

## 2021-07-29 ENCOUNTER — Other Ambulatory Visit: Payer: Self-pay

## 2021-07-29 DIAGNOSIS — I2510 Atherosclerotic heart disease of native coronary artery without angina pectoris: Secondary | ICD-10-CM

## 2021-08-28 ENCOUNTER — Encounter: Payer: Self-pay | Admitting: Cardiology

## 2021-08-28 ENCOUNTER — Ambulatory Visit: Payer: 59 | Admitting: Cardiology

## 2021-08-28 ENCOUNTER — Other Ambulatory Visit: Payer: Self-pay

## 2021-08-28 VITALS — BP 124/78 | HR 49 | Ht 71.0 in | Wt 152.0 lb

## 2021-08-28 DIAGNOSIS — Z01812 Encounter for preprocedural laboratory examination: Secondary | ICD-10-CM

## 2021-08-28 DIAGNOSIS — E7800 Pure hypercholesterolemia, unspecified: Secondary | ICD-10-CM | POA: Diagnosis not present

## 2021-08-28 DIAGNOSIS — K219 Gastro-esophageal reflux disease without esophagitis: Secondary | ICD-10-CM | POA: Diagnosis not present

## 2021-08-28 DIAGNOSIS — Z79899 Other long term (current) drug therapy: Secondary | ICD-10-CM

## 2021-08-28 DIAGNOSIS — I25119 Atherosclerotic heart disease of native coronary artery with unspecified angina pectoris: Secondary | ICD-10-CM

## 2021-08-28 MED ORDER — ROSUVASTATIN CALCIUM 20 MG PO TABS: 20.0000 mg | ORAL_TABLET | Freq: Every day | ORAL | 3 refills | Status: DC

## 2021-08-28 NOTE — Assessment & Plan Note (Signed)
Significantly high coronary artery calcium score in the 2000 range.  Showed he and his wife images from CT.  Multivessel.  With his symptoms of chest burning which have been attributed to heartburn in the past, I am concerned that this may be an anginal equivalent possibly.  We will go ahead and proceed with left heart catheterization.  Risks and benefits including stroke heart attack death renal impairment bleeding have been discussed.  Willing to proceed.  Wife present for discussion. -Continue with aspirin 81 mg - Increase Crestor from 5 mg up to 20 mg high intensity dose.  Last LDL 90.  Repeat lipid panel in 3 months.  ALT.

## 2021-08-28 NOTE — Assessment & Plan Note (Signed)
Increasing Crestor.  LDL goal less than 70, likely should approach less than 55.  No myalgias currently.
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
## 2021-08-28 NOTE — Assessment & Plan Note (Signed)
Currently on famotidine.  Previous EGD.  Dr. Carlean Purl.

## 2021-08-28 NOTE — Patient Instructions (Signed)
Medication Instructions:  Please increase Crestor to 20 mg a day. Continue all other medications as listed.  *If you need a refill on your cardiac medications before your next appointment, please call your pharmacy*   Lab Work: Please have blood work today (CBC, BMP) And in 3 months (Lipid,ALT)  If you have labs (blood work) drawn today and your tests are completely normal, you will receive your results only by: [place] (if you have [place]) OR A paper copy in the mail If you have any lab test that is abnormal or we need to change your treatment, we will call you to review the results.   Testing/Procedures:   [place] OFFICE [place], [address] [place] [address] Dept: [phone] Loc: [place]  08/28/2021  You are scheduled for a Cardiac Catheterization on Friday, February 10 with Dr. Peter Martinique.  1. Please arrive at the [facility] (Main Entrance A) at [facility]: [address] [place], [place] [address] at 5:30 AM (two hours before your procedure to ensure your preparation). Free valet parking service is available.   Special note: Every effort is made to have your procedure done on time. Please understand that emergencies sometimes delay scheduled procedures.  2. Diet: Do not eat or drink anything after midnight prior to your procedure except sips of water to take medications.  3. Labs:  today  4. Medication instructions in preparation for your procedure:  On the morning of your procedure, take your Aspirin and any morning medicines NOT listed above.  You may use sips of water.  5. Plan for one night stay--bring personal belongings. 6. Bring a current list of your medications and current insurance cards. 7. You MUST have a responsible person to drive you home. 8. Someone MUST be with you the first 24 hours after you arrive home or your  discharge will be delayed. 9. Please wear clothes that are easy to get on and off and wear slip-on shoes.  Thank you for allowing [place] to care for you!   -- [place] [facility] services   Follow-Up: At [facility], you and your health needs are our priority.  As part of our continuing mission to provide you with exceptional heart care, we have created designated Provider Care Teams.  These Care Teams include your primary Cardiologist (physician) and Advanced Practice Providers (APPs -  Physician Assistants and Nurse Practitioners) who all work together to provide you with the care you need, when you need it.  We recommend signing up for the patient portal called "MyChart".  Sign up information is provided on this After Visit Summary.  MyChart is used to connect with patients for Virtual Visits (Telemedicine).  Patients are able to view lab/test results, encounter notes, upcoming appointments, etc.  Non-urgent messages can be sent to your provider as well.   To learn more about what you can do with MyChart, go to [email or web address].    Your next appointment:   3 month(s)  The format for your next appointment:   In Person  Provider:   Dr Candee Furbish If primary card or EP is not listed click here to update    :1}    Thank you for choosing [facility]!!

## 2021-08-29 LAB — BASIC METABOLIC PANEL
BUN/Creatinine Ratio: 22 (ref 10–24)
BUN: 13 mg/dL (ref 8–27)
CO2: 25 mmol/L (ref 20–29)
Calcium: 9.1 mg/dL (ref 8.6–10.2)
Chloride: 108 mmol/L — ABNORMAL HIGH (ref 96–106)
Creatinine, Ser: 0.6 mg/dL — ABNORMAL LOW (ref 0.76–1.27)
Glucose: 85 mg/dL (ref 70–99)
Potassium: 4.8 mmol/L (ref 3.5–5.2)
Sodium: 149 mmol/L — ABNORMAL HIGH (ref 134–144)
eGFR: 106 mL/min/{1.73_m2} (ref >59)

## 2021-08-29 LAB — CBC
Hematocrit: 43.4 % (ref 37.5–51.0)
Hemoglobin: 14.8 g/dL (ref 13.0–17.7)
MCH: 30.8 pg (ref 26.6–33.0)
MCHC: 34.1 g/dL (ref 31.5–35.7)
MCV: 90 fL (ref 79–97)
Platelets: 203 10*3/uL (ref 150–450)
RBC: 4.81 x10E6/uL (ref 4.14–5.80)
RDW: 14.2 % (ref 11.6–15.4)
WBC: 4.8 10*3/uL (ref 3.4–10.8)

## 2021-09-03 ENCOUNTER — Telehealth: Payer: Self-pay | Admitting: *Deleted

## 2021-09-03 NOTE — Telephone Encounter (Addendum)
Cardiac catheterization scheduled at [facility] for: Friday September 04, 2021 7:30 [facility] Main Entrance A [facility]) at: 5:30 AM   Diet-no solid food after midnight prior to cath, clear liquids until 5 AM day of procedure.  Medication instructions for procedure: -Usual morning medications can be taken pre-cath with sips of water including aspirin 81 mg.    Must have responsible adult to drive home post procedure and be with patient first 24 hours after arriving home.  [facility] does allow one visitor to accompany you and wait in the hospital waiting room while you are there for your procedure. You and your visitor will be asked to wear a mask once you enter the hospital.   Patient reports does not currently have any new symptoms concerning for COVID-19 and no household members with COVID-19 like illness.               Reviewed procedure instructions with patient.

## 2021-09-04 ENCOUNTER — Encounter (HOSPITAL_COMMUNITY): Payer: Self-pay | Admitting: Cardiology

## 2021-09-04 ENCOUNTER — Encounter (HOSPITAL_COMMUNITY)
Admission: RE | Disposition: A | Discharge status: Home / Self Care | Payer: Self-pay | Source: Ambulatory Visit | Attending: Cardiology

## 2021-09-04 ENCOUNTER — Ambulatory Visit (HOSPITAL_COMMUNITY)
Admission: RE | Admit: 2021-09-04 | Discharge: 2021-09-04 | Disposition: A | Discharge status: Home / Self Care | Payer: 59 | Source: Ambulatory Visit | Attending: Cardiology | Admitting: Cardiology

## 2021-09-04 ENCOUNTER — Other Ambulatory Visit: Payer: Self-pay

## 2021-09-04 DIAGNOSIS — E7800 Pure hypercholesterolemia, unspecified: Secondary | ICD-10-CM | POA: Insufficient documentation

## 2021-09-04 DIAGNOSIS — Z01812 Encounter for preprocedural laboratory examination: Secondary | ICD-10-CM

## 2021-09-04 DIAGNOSIS — K219 Gastro-esophageal reflux disease without esophagitis: Secondary | ICD-10-CM | POA: Diagnosis not present

## 2021-09-04 DIAGNOSIS — Z79899 Other long term (current) drug therapy: Secondary | ICD-10-CM | POA: Diagnosis not present

## 2021-09-04 DIAGNOSIS — I25119 Atherosclerotic heart disease of native coronary artery with unspecified angina pectoris: Secondary | ICD-10-CM

## 2021-09-04 HISTORY — PX: LEFT HEART CATH AND CORONARY ANGIOGRAPHY: CATH118249

## 2021-09-04 SURGERY — LEFT HEART CATH AND CORONARY ANGIOGRAPHY
Anesthesia: LOCAL

## 2021-09-04 MED ORDER — SODIUM CHLORIDE 0.9% FLUSH: 3.0000 mL | INTRAVENOUS | Status: DC | PRN

## 2021-09-04 MED ORDER — IOHEXOL 350 MG/ML SOLN
INTRAVENOUS | Status: DC | PRN
  Administered 2021-09-04: 70 mL

## 2021-09-04 MED ORDER — VERAPAMIL HCL 2.5 MG/ML IV SOLN
INTRAVENOUS | Status: AC
  Filled 2021-09-04: qty 2

## 2021-09-04 MED ORDER — HEPARIN SODIUM (PORCINE) 1000 UNIT/ML IJ SOLN
INTRAMUSCULAR | Status: DC | PRN
  Administered 2021-09-04: 3500 [IU] via INTRAVENOUS

## 2021-09-04 MED ORDER — METOPROLOL SUCCINATE ER 25 MG PO TB24: 25.0000 mg | ORAL_TABLET | Freq: Every day | ORAL | 11 refills | Status: DC

## 2021-09-04 MED ORDER — ASPIRIN 81 MG PO CHEW: 81.0000 mg | CHEWABLE_TABLET | ORAL | Status: DC

## 2021-09-04 MED ORDER — LIDOCAINE HCL (PF) 1 % IJ SOLN
INTRAMUSCULAR | Status: DC | PRN
  Administered 2021-09-04: 2 mL via INTRADERMAL

## 2021-09-04 MED ORDER — SODIUM CHLORIDE 0.9 % WEIGHT BASED INFUSION
3.0000 mL/kg/h | INTRAVENOUS | Status: AC
  Administered 2021-09-04: 3 mL/kg/h via INTRAVENOUS

## 2021-09-04 MED ORDER — LIDOCAINE HCL (PF) 1 % IJ SOLN
INTRAMUSCULAR | Status: AC
  Filled 2021-09-04: qty 30

## 2021-09-04 MED ORDER — VERAPAMIL HCL 2.5 MG/ML IV SOLN
INTRAVENOUS | Status: DC | PRN
  Administered 2021-09-04: 10 mL via INTRA_ARTERIAL

## 2021-09-04 MED ORDER — HEPARIN SODIUM (PORCINE) 1000 UNIT/ML IJ SOLN
INTRAMUSCULAR | Status: AC
  Filled 2021-09-04: qty 10

## 2021-09-04 MED ORDER — FENTANYL CITRATE (PF) 100 MCG/2ML IJ SOLN
INTRAMUSCULAR | Status: AC
  Filled 2021-09-04: qty 2

## 2021-09-04 MED ORDER — SODIUM CHLORIDE 0.9% FLUSH: 3.0000 mL | Freq: Two times a day (BID) | INTRAVENOUS | Status: DC

## 2021-09-04 MED ORDER — ACETAMINOPHEN 325 MG PO TABS: 650.0000 mg | ORAL_TABLET | ORAL | Status: DC | PRN

## 2021-09-04 MED ORDER — SODIUM CHLORIDE 0.9 % IV SOLN: 250.0000 mL | INTRAVENOUS | Status: DC | PRN

## 2021-09-04 MED ORDER — SODIUM CHLORIDE 0.9 % WEIGHT BASED INFUSION: 1.0000 mL/kg/h | INTRAVENOUS | Status: AC

## 2021-09-04 MED ORDER — FENTANYL CITRATE (PF) 100 MCG/2ML IJ SOLN
INTRAMUSCULAR | Status: DC | PRN
  Administered 2021-09-04: 25 ug via INTRAVENOUS

## 2021-09-04 MED ORDER — MIDAZOLAM HCL 2 MG/2ML IJ SOLN
INTRAMUSCULAR | Status: DC | PRN
  Administered 2021-09-04: 1 mg via INTRAVENOUS

## 2021-09-04 MED ORDER — MIDAZOLAM HCL 2 MG/2ML IJ SOLN
INTRAMUSCULAR | Status: AC
  Filled 2021-09-04: qty 2

## 2021-09-04 MED ORDER — ONDANSETRON HCL 4 MG/2ML IJ SOLN: 4.0000 mg | Freq: Four times a day (QID) | INTRAMUSCULAR | Status: DC | PRN

## 2021-09-04 MED ORDER — SODIUM CHLORIDE 0.9 % WEIGHT BASED INFUSION: 1.0000 mL/kg/h | INTRAVENOUS | Status: DC

## 2021-09-04 MED ORDER — HEPARIN (PORCINE) IN NACL 1000-0.9 UT/500ML-% IV SOLN
INTRAVENOUS | Status: DC | PRN
  Administered 2021-09-04 (×2): 500 mL

## 2021-09-04 MED ORDER — HEPARIN (PORCINE) IN NACL 1000-0.9 UT/500ML-% IV SOLN
INTRAVENOUS | Status: AC
  Filled 2021-09-04: qty 1000

## 2021-09-04 SURGICAL SUPPLY — 9 items

## 2021-09-04 NOTE — Discharge Instructions (Addendum)
Add Toprol XL 25 mg daily 

## 2021-09-04 NOTE — Progress Notes (Signed)
Notified Dr Martinique client's heart rate in the 40's and per Dr Martinique client advised to not take metoprolol and client voiced understanding

## 2021-09-04 NOTE — Interval H&P Note (Signed)
History and Physical Interval Note:  09/04/2021 7:18 AM  Adriana Reams  has presented today for surgery, with the diagnosis of cad.  The various methods of treatment have been discussed with the patient and family. After consideration of risks, benefits and other options for treatment, the patient has consented to  Procedure(s): LEFT HEART CATH AND CORONARY ANGIOGRAPHY (N/A) as a surgical intervention.  The patient's history has been reviewed, patient examined, no change in status, stable for surgery.  I have reviewed the patient's chart and labs.  Questions were answered to the patient's satisfaction.    Cath Lab Visit (complete for each Cath Lab visit)  Clinical Evaluation Leading to the Procedure:   ACS: No.  Non-ACS:    Anginal Classification: CCS I  Anti-ischemic medical therapy: No Therapy  Non-Invasive Test Results: No non-invasive testing performed  Prior CABG: No previous CABG       Collier Salina [facility] 09/04/2021 7:19 AM

## 2021-09-04 NOTE — Progress Notes (Signed)
Client's wife notified of Dr Martinique d/c'ed metoprolol and she voiced understanding

## 2021-09-11 ENCOUNTER — Encounter: Payer: Self-pay | Admitting: Cardiology

## 2021-10-09 ENCOUNTER — Encounter: Payer: Self-pay | Admitting: Cardiology

## 2021-10-09 ENCOUNTER — Ambulatory Visit: Payer: 59 | Admitting: Cardiology

## 2021-10-09 ENCOUNTER — Other Ambulatory Visit: Payer: Self-pay

## 2021-10-09 DIAGNOSIS — I25119 Atherosclerotic heart disease of native coronary artery with unspecified angina pectoris: Secondary | ICD-10-CM | POA: Diagnosis not present

## 2021-10-09 DIAGNOSIS — E7800 Pure hypercholesterolemia, unspecified: Secondary | ICD-10-CM

## 2021-10-09 NOTE — Assessment & Plan Note (Signed)
Excellent results with Crestor.  As above.  Normal liver function as well.  Labs performed with Dr. Laurann Montana. ?

## 2021-10-09 NOTE — Patient Instructions (Signed)
Medication Instructions:  ?The current medical regimen is effective;  continue present plan and medications. ? ?*If you need a refill on your cardiac medications before your next appointment, please call your pharmacy* ? ?Follow-Up: ?At [facility], you and your health needs are our priority.  As part of our continuing mission to provide you with exceptional heart care, we have created designated Provider Care Teams.  These Care Teams include your primary Cardiologist (physician) and Advanced Practice Providers (APPs -  Physician Assistants and Nurse Practitioners) who all work together to provide you with the care you need, when you need it. ? ?We recommend signing up for the patient portal called "MyChart".  Sign up information is provided on this After Visit Summary.  MyChart is used to connect with patients for Virtual Visits (Telemedicine).  Patients are able to view lab/test results, encounter notes, upcoming appointments, etc.  Non-urgent messages can be sent to your provider as well.   ?To learn more about what you can do with MyChart, go to [email or web address].   ? ?Your next appointment:   ?6 month(s) ? ?The format for your next appointment:   ?In Person ? ?Provider:   ?Dr Marlou Porch ? ?If primary card or EP is not listed click here to update    :1}  ? ? ?Thank you for choosing [facility]!! ? ? ? ?

## 2021-10-09 NOTE — Progress Notes (Signed)
?Cardiology Office Note:   ? ?Date:  10/09/2021  ? ?ID:  Willie Hernandez, DOB 04/03/1956, MRN [id number] ? ?PCP:  Willie Orn, MD ?  ?[facility] Providers ?Cardiologist:  None    ? ?Referring MD: Willie Orn, MD  ? ? ?History of Present Illness:   ? ?Willie Hernandez is a 66 y.o. male with coronary artery disease as described below cardiac catheterization with family history of sudden cardiac death, father died suddenly at age 54 (he was talking with him at that time).  Overall very healthy lifestyle over the last several years exercises vigorously biking. ? ?He has been diagnosed with GERD.  Dr. Carlean Hernandez ? ?Felt some central chest burning.  Enjoys coffee.  During heavy exertional exercise he does not notice any exertional angina. ? 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 ?  ?He does have children, 1 son is in his 61s.   ? ?Just returned from a trip to [place].  He will be going to [place] later in May. ? ?Past Medical History:  ?Diagnosis Date  ? Abdominal pain   ? Allergic rhinitis   ? Coronary atherosclerosis due to calcified coronary lesion   ? Elevated coronary artery calcium score   ? GERD (gastroesophageal reflux disease)   ? Hallux rigidus   ? with heel cord tightness, Dr. Sharol Hernandez  ? Hallux rigidus of both feet   ? Hearing loss in right ear   ? noise exposure  ? History of dizziness   ? Hx of adenomatous colonic polyps 07/14/2017  ? Leukoplakia of gingiva   ? Mitral valve prolapse   ? mild mitral regurgitation  ? Osteoarthritis   ? PONV (postoperative nausea and vomiting)   ? from Versed  ? Retinal detachment   ? Rhegmatogenous retinal detachment of left eye   ? Spermatocele   ? Right, Dr. Risa Hernandez  ? ? ?Past Surgical History:  ?Procedure Laterality Date  ? COLONOSCOPY  1997, 2006  ? corrective jaw surgery  2001  ? for overbite  ? eye lens replaced  05/27/2018  ? at [facility] eye center  ? GAS INSERTION Left 09/07/2016  ? Procedure: INSERTION OF GAS-C3F8;  Surgeon: Willie Pedro, MD;   Location: [place];  Service: Ophthalmology;  Laterality: Left;  ? LASER PHOTO ABLATION Bilateral 09/07/2016  ? Procedure: LASER PHOTO ABLATION RIGHT EYE;  Surgeon: Willie Pedro, MD;  Location: [place];  Service: Ophthalmology;  Laterality: Bilateral;  ? laser retinal tears OS  04/2012  ? LEFT HEART CATH AND CORONARY ANGIOGRAPHY N/A 09/04/2021  ? Procedure: LEFT HEART CATH AND CORONARY ANGIOGRAPHY;  Surgeon: Martinique, Willie M, MD;  Location: [place] [facility];  Service: Cardiovascular;  Laterality: N/A;  ? SCLERAL BUCKLE WITH CRYO Left 09/07/2016  ? Procedure: SCLERAL BUCKLE WITH CRYO;  Surgeon: Willie Pedro, MD;  Location: [place];  Service: Ophthalmology;  Laterality: Left;  ? TOTAL HIP ARTHROPLASTY Right 06/30/2018  ? Procedure: RIGHT TOTAL HIP ARTHROPLASTY ANTERIOR APPROACH;  Surgeon: Willie Rossetti, MD;  [facility];  Service: Orthopedics;  Laterality: Right;  ? UPPER GASTROINTESTINAL ENDOSCOPY    ? WISDOM TOOTH EXTRACTION    ? age 55  ? ? ?Current Medications: ?Current Meds  ?Medication Sig  ? aspirin EC 81 MG tablet Take 81 mg by mouth daily. Swallow whole.  ? cholecalciferol (VITAMIN D) 1000 UNITS tablet Take 1,000 Units by mouth daily.  ? famotidine (PEPCID) 20 MG tablet Take 20 mg by mouth  daily as needed for heartburn or indigestion.  ? fexofenadine (ALLEGRA) 180 MG tablet Take 180 mg by mouth daily as needed for allergies.  ? fluticasone (FLONASE) 50 MCG/ACT nasal spray Place 2 sprays into both nostrils in the morning.  ? metoprolol succinate (TOPROL-XL) 25 MG 24 hr tablet Take 25 mg by mouth daily.  ? olopatadine (PATANOL) 0.1 % ophthalmic solution Place 1 drop into both eyes at bedtime.  ? prednisoLONE acetate (PRED FORTE) 1 % ophthalmic suspension Place 1 drop into the left eye 4 (four) times daily.  ? rosuvastatin (CRESTOR) 20 MG tablet Take 1 tablet (20 mg total) by mouth daily.  ? vitamin C (ASCORBIC ACID) 500 MG tablet Take 500 mg by mouth daily.  ?  ? ?Allergies:   Doxycycline hyclate,  Molds & smuts, and Sulfa antibiotics  ? ?Social History  ? ?Socioeconomic History  ? Marital status: Married  ?  Spouse name: Willie Hernandez  ? Number of children: 2  ? Years of education: Not on file  ? Highest education level: Not on file  ?Occupational History  ? Occupation: Forensic psychologist for [employer].  ?  Employer: [employer]   ?Tobacco Use  ? Smoking status: Never  ? Smokeless tobacco: Never  ?Vaping Use  ? Vaping Use: Never used  ?Substance and Sexual Activity  ? Alcohol use: Yes  ?  Comment: occasional  ? Drug use: No  ? Sexual activity: Not on file  ?Other Topics Concern  ? Not on file  ?Social History Narrative  ? Not on file  ? ?Social Determinants of Health  ? ?Financial Resource Strain: Not on file  ?Food Insecurity: Not on file  ?Transportation Needs: Not on file  ?Physical Activity: Not on file  ?Stress: Not on file  ?Social Connections: Not on file  ?  ? ?Family History: ?The patient's family history includes Alzheimer's disease in his mother; Atrial fibrillation in his father; Colon cancer in his paternal grandfather; Congestive Heart Failure in his mother; Esophageal cancer in his unknown relative; Sudden death in his father. There is no history of Colon polyps, Rectal cancer, or Stomach cancer. ? ?ROS:   ?Please see the history of present illness.    ? All other systems reviewed and are negative. ? ?EKGs/Labs/Other Studies Reviewed:   ? ?The following studies were reviewed today: ?Cardiac catheterization 09/04/2021: ?Prox LAD to Mid LAD lesion is 40% stenosed. ?  Mid LAD lesion is 75% stenosed. ?  2nd Diag lesion is 70% stenosed. ?  1st Mrg lesion is 40% stenosed. ?  Prox RCA lesion is 30% stenosed. ?  Mid RCA lesion is 30% stenosed. ?  The left ventricular systolic function is normal. ?  LV end diastolic pressure is normal. ?  The left ventricular ejection fraction is 50-55% by visual estimate. ?  There is no mitral valve regurgitation. ? ?Diagnostic ?Dominance: Right ? ?  ?Moderate single vessel  obstructive CAD. True bifurcation lesion in the mid LAD at the second diagonal bifurcation ?Normal LV function ?Normal LV filling pressures ?  ?Plan: recommend guideline directed medical therapy. Patient is currently highly functional. If he has refractory angina despite good medical therapy we could consider PCI of the LAD but PCI is more complex since it involves a bifurcation and is calcified. Will start by adding Toprol XL 25 mg daily.  ?  ?Coronary calcium score 07/29/2021 personally reviewed ?Left Main: 48 ?  ?LAD: 1,254 ?  ?LCx: 496 ?  ?RCA: 561 ?  ?Total  Agatston Score: 2,358 ?  ?MESA database percentile: 41 ?  ?  ? ? ? ?Recent Labs: ?04/03/2021: ALT 63 ?08/28/2021: BUN 13; Creatinine, Ser 0.60; Hemoglobin 14.8; Platelets 203; Potassium 4.8; Sodium 149  ?Recent Lipid Panel ?No results found for: CHOL, TRIG, HDL, CHOLHDL, VLDL, LDLCALC, LDLDIRECT ? ? ?Risk Assessment/Calculations:   ? ? ?    ? ?   ? ?Physical Exam:   ? ?VS:  BP 130/82   Pulse (!) 54   Ht '5\' 11"'$  (1.803 Hernandez)   Wt 152 lb (68.9 kg)   SpO2 96%   BMI 21.20 kg/Hernandez?    ? ?Wt Readings from Last 3 Encounters:  ?10/09/21 152 lb (68.9 kg)  ?09/04/21 152 lb (68.9 kg)  ?08/28/21 152 lb (68.9 kg)  ?  ? ?GEN:  Well nourished, well developed in no acute distress ?HEENT: Normal ?NECK: No JVD; No carotid bruits ?LYMPHATICS: No lymphadenopathy ?CARDIAC: RRR, no murmurs, no rubs, gallops ?RESPIRATORY:  Clear to auscultation without rales, wheezing or rhonchi  ?ABDOMEN: Soft, non-tender, non-distended ?MUSCULOSKELETAL:  No edema; No deformity  ?SKIN: Warm and dry ?NEUROLOGIC:  Alert and oriented x 3 ?PSYCHIATRIC:  Normal affect  ? ?ASSESSMENT:   ? ?1. Coronary artery disease involving native coronary artery of native heart with angina pectoris ([place])   ?2. Pure hypercholesterolemia   ? ?PLAN:   ? ?In order of problems listed above: ? ?Coronary artery disease involving native coronary artery of native heart with angina pectoris ([place]) ?Personally reviewed and interpreted  and demonstrated cardiac catheterization to he and his wife.  Mid LAD bifurcation lesion calcification described.  We will continue with aggressive medical management, goal-directed medical therapy.  His last LDL was

## 2021-10-09 NOTE — Assessment & Plan Note (Signed)
Personally reviewed and interpreted and demonstrated cardiac catheterization to he and his wife.  Mid LAD bifurcation lesion calcification described.  We will continue with aggressive medical management, goal-directed medical therapy.  His last LDL was 56 excellent this is on Crestor 20 mg.  He is taking the Toprol XL 25 mg.  He did note some  slight sluggishness at the end of the day.  I recommended he try the medication in the evening.  Overall he can continue with exercise.  If symptoms arise, we can always consider stenting of the LAD.  Continue with aspirin. ?

## 2021-10-10 DIAGNOSIS — H2000 Unspecified acute and subacute iridocyclitis: Secondary | ICD-10-CM | POA: Insufficient documentation

## 2021-11-26 ENCOUNTER — Other Ambulatory Visit: Payer: 59

## 2021-11-30 ENCOUNTER — Ambulatory Visit: Payer: 59 | Admitting: Cardiology

## 2022-03-15 ENCOUNTER — Encounter: Payer: Self-pay | Admitting: Cardiology

## 2022-03-17 ENCOUNTER — Telehealth: Payer: Self-pay | Admitting: *Deleted

## 2022-03-17 NOTE — Telephone Encounter (Signed)
   Pre-operative Risk Assessment    Patient Name: Willie Hernandez  DOB: [birth year] MRN: [id number]      Request for Surgical Clearance    Procedure:   HERNIA SURGERY  Date of Surgery:  Clearance TBD                                 Surgeon:  DR. Ralene Ok  Surgeon's Group or Practice Name:  [facility] Phone number:  [phone] Fax number:  [phone] ATTN: Carlene Coria, CMA   Type of Clearance Requested:   - Medical ; ASA HOLD INSTRUCTIONS NEEDED   Type of Anesthesia:  General    Additional requests/questions:    Jiles Prows   03/17/2022, 10:11 AM

## 2022-03-17 NOTE — Telephone Encounter (Signed)
His aspirin may be held for 5-7 days prior to his surgery.  Please resume as soon as hemostasis is achieved  Preoperative team, please contact this patient and set up a phone call appointment for further cardiac evaluation.  Thank you for your help.  Jossie Ng. Izabell Schalk NP-C    03/17/2022, 1:18 PM [facility] [address] Office [phone] Fax [phone]

## 2022-03-18 ENCOUNTER — Telehealth: Payer: Self-pay | Admitting: *Deleted

## 2022-03-18 ENCOUNTER — Ambulatory Visit (INDEPENDENT_AMBULATORY_CARE_PROVIDER_SITE_OTHER): Payer: 59 | Admitting: Physician Assistant

## 2022-03-18 DIAGNOSIS — Z01810 Encounter for preprocedural cardiovascular examination: Secondary | ICD-10-CM | POA: Diagnosis not present

## 2022-03-18 NOTE — Telephone Encounter (Signed)
Pt called back and he is agreeable to add on today for pre op clearance, see previous notes from today. Pt also tells me that he has been holding ASA as of his last dose was 03/15/22. I will add pt on to today 3:20. All schedules for the rest of the week are full.   Meds not reviewed but consent has been given.

## 2022-03-18 NOTE — Telephone Encounter (Signed)
Theophilus Kinds RN came to me stating she is covering Dr. Marlou Porch nurse's basket and saw a MY CHART message from the pt who states the surgery is 03/22/22. The clearance request came to [place] as TBD. Pt is going to need to be added on to pre op schedule today at 3:20 in order to have time to hold ASA. Left message for the pt to call back and ask for the pre op team.

## 2022-03-18 NOTE — Progress Notes (Addendum)
Virtual Visit via Telephone Note   Because of Willie Hernandez co-morbid illnesses, he is at least at moderate risk for complications without adequate follow up.  This format is felt to be most appropriate for this patient at this time.  The patient did not have access to video technology/had technical difficulties with video requiring transitioning to audio format only (telephone).  All issues noted in this document were discussed and addressed.  No physical exam could be performed with this format.  Please refer to the patient's chart for his consent to telehealth for [facility].  Evaluation Performed:  Preoperative cardiovascular risk assessment _____________   Date:  03/18/2022   Patient ID:  Willie Hernandez, DOB 07-Sep-1955, MRN [id number] Patient Location:  Home Provider location:   Office  Primary Care Provider:  Lavone Orn, MD Primary Cardiologist:  None  Chief Complaint / Patient Profile   66 y.o. y/o male with a h/o CAD, family history of sudden cardiac death (father died suddenly at age 11) who is pending hernia surgery and presents today for telephonic preoperative cardiovascular risk assessment.  Past Medical History    Past Medical History:  Diagnosis Date   Abdominal Hernandez    Allergic rhinitis    Coronary atherosclerosis due to calcified coronary lesion    Elevated coronary artery calcium score    GERD (gastroesophageal reflux disease)    Hallux rigidus    with heel cord tightness, Dr. Sharol Hernandez   Hallux rigidus of both feet    Hearing loss in right ear    noise exposure   History of dizziness    Hx of adenomatous colonic polyps 07/14/2017   Leukoplakia of gingiva    Mitral valve prolapse    mild mitral regurgitation   Osteoarthritis    PONV (postoperative nausea and vomiting)    from Versed   Retinal detachment    Rhegmatogenous retinal detachment of left eye    Spermatocele    Right, Dr. Risa Hernandez   Past Surgical History:  Procedure Laterality  Date   COLONOSCOPY  1997, 2006   corrective jaw surgery  2001   for overbite   eye lens replaced  05/27/2018   at [facility] eye center   GAS INSERTION Left 09/07/2016   Procedure: INSERTION OF GAS-C3F8;  Surgeon: Willie Pedro, MD;  Location: [place];  Service: Ophthalmology;  Laterality: Left;   LASER PHOTO ABLATION Bilateral 09/07/2016   Procedure: LASER PHOTO ABLATION RIGHT EYE;  Surgeon: Willie Pedro, MD;  Location: [place];  Service: Ophthalmology;  Laterality: Bilateral;   laser retinal tears OS  04/2012   LEFT HEART CATH AND CORONARY ANGIOGRAPHY N/A 09/04/2021   Procedure: LEFT HEART CATH AND CORONARY ANGIOGRAPHY;  Surgeon: Martinique, Peter M, MD;  Location: [facility] [facility];  Service: Cardiovascular;  Laterality: N/A;   SCLERAL BUCKLE WITH CRYO Left 09/07/2016   Procedure: SCLERAL BUCKLE WITH CRYO;  Surgeon: Willie Pedro, MD;  Location: [place];  Service: Ophthalmology;  Laterality: Left;   TOTAL HIP ARTHROPLASTY Right 06/30/2018   Procedure: RIGHT TOTAL HIP ARTHROPLASTY ANTERIOR APPROACH;  Surgeon: Willie Rossetti, MD;  [facility];  Service: Orthopedics;  Laterality: Right;   UPPER GASTROINTESTINAL ENDOSCOPY     WISDOM TOOTH EXTRACTION     age 31    Allergies  Allergies  Allergen Reactions   Doxycycline Hyclate Other (See Comments)    Sores in mouth   Molds & Smuts Cough    Sneezing, Nasal congestion  Sulfa Antibiotics Hives and Other (See Comments)    flushing    History of Present Illness    Willie Hernandez is a 66 y.o. male who presents via audio/video conferencing for a telehealth visit today.  Pt was last seen in cardiology clinic on 10/09/2021 by Dr. Marlou Hernandez.  At that time Willie Hernandez was doing well .  The patient is now pending procedure as outlined above. Since his last visit, he feels pretty good without any cardiac symptoms.  He states that he does have GERD which can mimic cardiac issues.  He states he has a [place] watch and he checks his 7-day heart  rates which have all been fine.  Last week when he was in [place] and was walking all over the city about 2 miles.  He does okay with stairs.  Because of this he is scored a 5.62 METS on the DASI.  This exceeds the 4 METS minimum requirement.  His aspirin may be held for 5-7 days prior to his surgery.  Please resume as soon as hemostasis is achieved.  Of note he has already stopped his aspirin since his surgery this coming Monday.   Home Medications    Prior to Admission medications   Medication Sig Start Date End Date Taking? Authorizing Provider  aspirin EC 81 MG tablet Take 81 mg by mouth daily. Swallow whole.    [provider]  cholecalciferol (VITAMIN D) 1000 UNITS tablet Take 1,000 Units by mouth daily.    [provider]  famotidine (PEPCID) 20 MG tablet Take 20 mg by mouth daily as needed for heartburn or indigestion.    [provider]  fexofenadine (ALLEGRA) 180 MG tablet Take 180 mg by mouth daily as needed for allergies.    [provider]  fluticasone (FLONASE) 50 MCG/ACT nasal spray Place 2 sprays into both nostrils in the morning.    [provider]  metoprolol succinate (TOPROL-XL) 25 MG 24 hr tablet Take 25 mg by mouth daily. 09/04/21   [provider]  olopatadine (PATANOL) 0.1 % ophthalmic solution Place 1 drop into both eyes at bedtime.    [provider]  prednisoLONE acetate (PRED FORTE) 1 % ophthalmic suspension Place 1 drop into the left eye 4 (four) times daily. 09/17/21   [provider]  rosuvastatin (CRESTOR) 20 MG tablet Take 1 tablet (20 mg total) by mouth daily. 08/28/21   Willie Pain, MD  vitamin C (ASCORBIC ACID) 500 MG tablet Take 500 mg by mouth daily.    [provider]    Physical Exam    Vital Signs: Blood pressure 117/82  Hernandez telephonic nature of communication, physical exam is limited. AAOx3. NAD. Normal affect.  Speech and respirations are unlabored.  Accessory  Clinical Findings    None  Assessment & Plan    1.  Preoperative Cardiovascular Risk Assessment:  [name] perioperative risk of a major cardiac event is 6.6% according to the Revised Cardiac Risk Index (RCRI).  Therefore, he is at high risk for perioperative complications.   His functional capacity is good at 5.62 METs according to the Duke Activity Status Index (DASI). Recommendations: According to ACC/AHA guidelines, no further cardiovascular testing needed.  The patient may proceed to surgery at acceptable risk.   Antiplatelet and/or Anticoagulation Recommendations: Aspirin can be held for 7 days prior to his surgery.  Please resume Aspirin post operatively when it is felt to be safe from a bleeding standpoint.  A  copy of this note will be routed to requesting surgeon.  Time:   Today, I have spent 15 minutes with the patient with telehealth technology discussing medical history, symptoms, and management plan.     Elgie Collard, PA-C  03/18/2022, 1:46 PM

## 2022-03-18 NOTE — Telephone Encounter (Signed)
Pt called back and he is agreeable to add on today for pre op clearance, see previous notes from today. Pt also tells me that he has been holding ASA as of his last dose was 03/15/22. I will add pt on to today 3:20. All schedules for the rest of the week are full.   Meds not reviewed but consent has been given.     Patient Consent for Virtual Visit        Willie Hernandez has provided verbal consent on 03/18/2022 for a virtual visit (video or telephone).   CONSENT FOR VIRTUAL VISIT FOR:  Willie Hernandez  By participating in this virtual visit I agree to the following:  I hereby voluntarily request, consent and authorize [facility] and its employed or contracted physicians, physician assistants, nurse practitioners or other licensed health care professionals (the Practitioner), to provide me with telemedicine health care services (the "Services") as deemed necessary by the treating Practitioner. I acknowledge and consent to receive the Services by the Practitioner via telemedicine. I understand that the telemedicine visit will involve communicating with the Practitioner through live audiovisual communication technology and the disclosure of certain medical information by electronic transmission. I acknowledge that I have been given the opportunity to request an in-person assessment or other available alternative prior to the telemedicine visit and am voluntarily participating in the telemedicine visit.  I understand that I have the right to withhold or withdraw my consent to the use of telemedicine in the course of my care at any time, without affecting my right to future care or treatment, and that the Practitioner or I may terminate the telemedicine visit at any time. I understand that I have the right to inspect all information obtained and/or recorded in the course of the telemedicine visit and may receive copies of available information for a reasonable fee.  I understand that some of the  potential risks of receiving the Services via telemedicine include:  Delay or interruption in medical evaluation due to technological equipment failure or disruption; Information transmitted may not be sufficient (e.g. poor resolution of images) to allow for appropriate medical decision making by the Practitioner; and/or  In rare instances, security protocols could fail, causing a breach of personal health information.  Furthermore, I acknowledge that it is my responsibility to provide information about my medical history, conditions and care that is complete and accurate to the best of my ability. I acknowledge that Practitioner's advice, recommendations, and/or decision may be based on factors not within their control, such as incomplete or inaccurate data provided by me or distortions of diagnostic images or specimens that may result from electronic transmissions. I understand that the practice of medicine is not an exact science and that Practitioner makes no warranties or guarantees regarding treatment outcomes. I acknowledge that a copy of this consent can be made available to me via my patient portal ([facility]), or I can request a printed copy by calling the office of [facility].    I understand that my insurance will be billed for this visit.   I have read or had this consent read to me. I understand the contents of this consent, which adequately explains the benefits and risks of the Services being provided via telemedicine.  I have been provided ample opportunity to ask questions regarding this consent and the Services and have had my questions answered to my satisfaction. I give my informed consent for the services to be provided through the use  of telemedicine in my medical care

## 2022-03-19 ENCOUNTER — Encounter: Payer: Self-pay | Admitting: Cardiology

## 2022-03-26 ENCOUNTER — Telehealth: Payer: Self-pay | Admitting: *Deleted

## 2022-03-26 DIAGNOSIS — Z79899 Other long term (current) drug therapy: Secondary | ICD-10-CM

## 2022-03-26 DIAGNOSIS — I25119 Atherosclerotic heart disease of native coronary artery with unspecified angina pectoris: Secondary | ICD-10-CM

## 2022-03-26 DIAGNOSIS — E7800 Pure hypercholesterolemia, unspecified: Secondary | ICD-10-CM

## 2022-03-26 NOTE — Telephone Encounter (Signed)
Lets go ahead and check a complete metabolic profile, CBC, lipid panel.  Thank you. Candee Furbish, MD  Lab scheduled for 9/8 as requested by pt.

## 2022-04-02 ENCOUNTER — Ambulatory Visit: Payer: 59 | Attending: Cardiovascular Disease

## 2022-04-03 LAB — COMPREHENSIVE METABOLIC PANEL
ALT: 27 IU/L (ref 0–44)
AST: 28 IU/L (ref 0–40)
Albumin/Globulin Ratio: 2.6 — ABNORMAL HIGH (ref 1.2–2.2)
Albumin: 4.2 g/dL (ref 3.9–4.9)
Alkaline Phosphatase: 48 IU/L (ref 44–121)
BUN/Creatinine Ratio: 17 (ref 10–24)
BUN: 10 mg/dL (ref 8–27)
Bilirubin Total: 0.8 mg/dL (ref 0.0–1.2)
CO2: 23 mmol/L (ref 20–29)
Calcium: 8.8 mg/dL (ref 8.6–10.2)
Chloride: 102 mmol/L (ref 96–106)
Creatinine, Ser: 0.59 mg/dL — ABNORMAL LOW (ref 0.76–1.27)
Globulin, Total: 1.6 g/dL (ref 1.5–4.5)
Glucose: 92 mg/dL (ref 70–99)
Potassium: 3.8 mmol/L (ref 3.5–5.2)
Sodium: 139 mmol/L (ref 134–144)
Total Protein: 5.8 g/dL — ABNORMAL LOW (ref 6.0–8.5)
eGFR: 106 mL/min/{1.73_m2} (ref >59)

## 2022-04-03 LAB — LIPID PANEL
Chol/HDL Ratio: 1.9 ratio (ref 0.0–5.0)
Cholesterol, Total: 139 mg/dL (ref 100–199)
HDL: 74 mg/dL (ref >39)
LDL Chol Calc (NIH): 54 mg/dL (ref 0–99)
Triglycerides: 48 mg/dL (ref 0–149)
VLDL Cholesterol Cal: 11 mg/dL (ref 5–40)

## 2022-04-03 LAB — CBC
Hematocrit: 42.2 % (ref 37.5–51.0)
Hemoglobin: 14.3 g/dL (ref 13.0–17.7)
MCH: 32.3 pg (ref 26.6–33.0)
MCHC: 33.9 g/dL (ref 31.5–35.7)
MCV: 95 fL (ref 79–97)
Platelets: 220 10*3/uL (ref 150–450)
RBC: 4.43 x10E6/uL (ref 4.14–5.80)
RDW: 12.6 % (ref 11.6–15.4)
WBC: 5 10*3/uL (ref 3.4–10.8)

## 2022-04-19 ENCOUNTER — Ambulatory Visit: Payer: 59 | Admitting: Cardiology

## 2022-04-19 NOTE — Progress Notes (Deleted)
Cardiology Office Note:    Date:  04/19/2022   ID:  Willie Hernandez, DOB 01-16-1956, MRN [id number]  PCP:  Lavone Orn, MD   [facility] Providers Cardiologist:  None     Referring MD: Lavone Orn, MD    History of Present Illness:    Willie Hernandez is a 66 y.o. male with coronary artery disease as described below cardiac catheterization with family history of sudden cardiac death, father died suddenly at age 63 (he was talking with him at that time).    Hernia repair 02/2022  Overall very healthy lifestyle over the last several years exercises vigorously biking.  He has been diagnosed with GERD.  Dr. Rosealee Albee some central chest burning.  Enjoys coffee.  During heavy exertional exercise he does not notice any exertional angina.  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   He does have children, 1 son is in his 53s.    Prior trip to [place] and [place] May 2023.  Past Medical History:  Diagnosis Date   Abdominal pain    Allergic rhinitis    Coronary atherosclerosis due to calcified coronary lesion    Elevated coronary artery calcium score    GERD (gastroesophageal reflux disease)    Hallux rigidus    with heel cord tightness, Dr. Sharol Given   Hallux rigidus of both feet    Hearing loss in right ear    noise exposure   History of dizziness    Hx of adenomatous colonic polyps 07/14/2017   Leukoplakia of gingiva    Mitral valve prolapse    mild mitral regurgitation   Osteoarthritis    PONV (postoperative nausea and vomiting)    from Versed   Retinal detachment    Rhegmatogenous retinal detachment of left eye    Spermatocele    Right, Dr. Risa Grill    Past Surgical History:  Procedure Laterality Date   COLONOSCOPY  1997, 2006   corrective jaw surgery  2001   for overbite   eye lens replaced  05/27/2018   at [facility] eye center   GAS INSERTION Left 09/07/2016   Procedure: INSERTION OF GAS-C3F8;  Surgeon: Hayden Pedro, MD;  Location: [place];  Service: Ophthalmology;  Laterality: Left;   LASER PHOTO ABLATION Bilateral 09/07/2016   Procedure: LASER PHOTO ABLATION RIGHT EYE;  Surgeon: Hayden Pedro, MD;  Location: [place];  Service: Ophthalmology;  Laterality: Bilateral;   laser retinal tears OS  04/2012   LEFT HEART CATH AND CORONARY ANGIOGRAPHY N/A 09/04/2021   Procedure: LEFT HEART CATH AND CORONARY ANGIOGRAPHY;  Surgeon: Martinique, Peter M, MD;  Location: [place] [facility];  Service: Cardiovascular;  Laterality: N/A;   SCLERAL BUCKLE WITH CRYO Left 09/07/2016   Procedure: SCLERAL BUCKLE WITH CRYO;  Surgeon: Hayden Pedro, MD;  Location: [place];  Service: Ophthalmology;  Laterality: Left;   TOTAL HIP ARTHROPLASTY Right 06/30/2018   Procedure: RIGHT TOTAL HIP ARTHROPLASTY ANTERIOR APPROACH;  Surgeon: Mcarthur Rossetti, MD;  [facility];  Service: Orthopedics;  Laterality: Right;   UPPER GASTROINTESTINAL ENDOSCOPY     WISDOM TOOTH EXTRACTION     age 72    Current Medications: No outpatient medications have been marked as taking for the 04/19/22 encounter (Appointment) with Jerline Pain, MD.     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    Employer: [employer]   Tobacco Use   Smoking status: Never   Smokeless tobacco: Never  Vaping Use   Vaping Use: Never used  Substance and Sexual Activity   Alcohol use: Yes    Comment: occasional   Drug use: No   Sexual activity: Not on file  Other Topics Concern   Not on file  Social History Narrative   Not on file   Social Determinants of Health   Financial Resource Strain: Not on file  Food Insecurity: Not on file  Transportation Needs: Not on file  Physical Activity: Not on  file  Stress: Not on file  Social Connections: Not on file     Family History: The patient's family history includes Alzheimer's disease in his mother; Atrial fibrillation in his father; Colon cancer in his paternal grandfather; Congestive Heart Failure in his mother; Esophageal cancer in his unknown relative; Sudden death in his father. There is no history of Colon polyps, Rectal cancer, or Stomach cancer.  ROS:   Please see the history of present illness.     All other systems reviewed and are negative.  EKGs/Labs/Other Studies Reviewed:    The following studies were reviewed today: Cardiac catheterization 09/04/2021: Prox LAD to Mid LAD lesion is 40% stenosed.   Mid LAD lesion is 75% stenosed.   2nd Diag lesion is 70% stenosed.   1st Mrg lesion is 40% stenosed.   Prox RCA lesion is 30% stenosed.   Mid RCA lesion is 30% stenosed.   The left ventricular systolic function is normal.   LV end diastolic pressure is normal.   The left ventricular ejection fraction is 50-55% by visual estimate.   There is no mitral valve regurgitation.  Diagnostic Dominance: Right    Moderate single vessel obstructive CAD. True bifurcation lesion in the mid LAD at the second diagonal bifurcation Normal LV function Normal LV filling pressures   Plan: recommend guideline directed medical therapy. Patient is currently highly functional. If he has refractory angina despite good medical therapy we could consider PCI of the LAD but PCI is more complex since it involves a bifurcation and is calcified. Will start by adding Toprol XL 25 mg daily.    Coronary calcium score 07/29/2021 personally reviewed Left Main: 47   LAD: 1,254   LCx: 496   RCA: 561   Total Agatston Score: 2,358   MESA database percentile: 97        Recent Labs: 04/02/2022: ALT 27; BUN 10; Creatinine, Ser 0.59; Hemoglobin 14.3; Platelets 220; Potassium 3.8; Sodium 139  Recent Lipid Panel    Component Value Date/Time   CHOL  139 04/02/2022 0815   TRIG 48 04/02/2022 0815   HDL 74 04/02/2022 0815   CHOLHDL 1.9 04/02/2022 0815   LDLCALC 54 04/02/2022 0815     Risk Assessment/Calculations:              Physical Exam:    VS:  There were no vitals taken for this visit.    Wt Readings from Last 3 Encounters:  10/09/21 152 lb (68.9 kg)  09/04/21 152 lb (68.9 kg)  08/28/21 152 lb (68.9 kg)     GEN:  Well nourished, well developed in no acute distress HEENT: Normal NECK: No JVD; No carotid  bruits LYMPHATICS: No lymphadenopathy CARDIAC: RRR, no murmurs, no rubs, gallops RESPIRATORY:  Clear to auscultation without rales, wheezing or rhonchi  ABDOMEN: Soft, non-tender, non-distended MUSCULOSKELETAL:  No edema; No deformity  SKIN: Warm and dry NEUROLOGIC:  Alert and oriented x 3 PSYCHIATRIC:  Normal affect   ASSESSMENT:    No diagnosis found.  PLAN:    In order of problems listed above:  Coronary artery disease involving native coronary artery of native heart with angina pectoris [facility]) Personally reviewed and interpreted and demonstrated cardiac catheterization to he and his wife.  Mid LAD bifurcation lesion calcification described.  We will continue with aggressive medical management, goal-directed medical therapy.  His last LDL was 56 excellent this is on Crestor 20 mg.  He is taking the Toprol XL 25 mg.  He did note some  slight sluggishness at the end of the day.  I recommended he try the medication in the evening.  Overall he can continue with exercise.  If symptoms arise, we can always consider stenting of the LAD.  Continue with aspirin.   Pure hypercholesterolemia Excellent results with Crestor.  As above.  Normal liver function as well.  Labs performed with Dr. Laurann Montana.           Medication Adjustments/Labs and Tests Ordered: Current medicines are reviewed at length with the patient today.  Concerns regarding medicines are outlined above.  No orders of the defined types were placed  in this encounter.  No orders of the defined types were placed in this encounter.   There are no Patient Instructions on file for this visit.   Signed, Candee Furbish, MD  04/19/2022 7:07 AM    [facility]

## 2022-06-21 ENCOUNTER — Ambulatory Visit: Payer: 59 | Attending: Cardiology | Admitting: Cardiology

## 2022-06-21 ENCOUNTER — Encounter: Payer: Self-pay | Admitting: Cardiology

## 2022-06-21 VITALS — BP 117/60 | HR 59 | Ht 71.0 in | Wt 155.0 lb

## 2022-06-21 DIAGNOSIS — I25119 Atherosclerotic heart disease of native coronary artery with unspecified angina pectoris: Secondary | ICD-10-CM

## 2022-06-21 DIAGNOSIS — E7800 Pure hypercholesterolemia, unspecified: Secondary | ICD-10-CM

## 2022-06-21 NOTE — Patient Instructions (Addendum)
Medication Instructions:  No changes at this time. *If you need a refill on your cardiac medications before your next appointment, please call your pharmacy*   Lab Work: None   Testing/Procedures: None   Follow-Up: At [facility], you and your health needs are our priority.  As part of our continuing mission to provide you with exceptional heart care, we have created designated Provider Care Teams.  These Care Teams include your primary Cardiologist (physician) and Advanced Practice Providers (APPs -  Physician Assistants and Nurse Practitioners) who all work together to provide you with the care you need, when you need it.  We recommend signing up for the patient portal called "MyChart".  Sign up information is provided on this After Visit Summary.  MyChart is used to connect with patients for Virtual Visits (Telemedicine).  Patients are able to view lab/test results, encounter notes, upcoming appointments, etc.  Non-urgent messages can be sent to your provider as well.   To learn more about what you can do with MyChart, go to [email or web address].    Your next appointment:   1 year(s)  The format for your next appointment:   In Person  Provider:   Candee Furbish, MD  Important Information About Sugar
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
## 2022-07-21 ENCOUNTER — Encounter: Payer: Self-pay | Admitting: Internal Medicine

## 2022-08-08 ENCOUNTER — Other Ambulatory Visit: Payer: Self-pay | Admitting: Cardiology

## 2022-08-14 ENCOUNTER — Other Ambulatory Visit: Payer: Self-pay | Admitting: Cardiology

## 2022-10-05 ENCOUNTER — Encounter: Payer: Self-pay | Admitting: Internal Medicine

## 2022-10-14 ENCOUNTER — Ambulatory Visit (AMBULATORY_SURGERY_CENTER): Payer: 59 | Admitting: *Deleted

## 2022-10-14 VITALS — Ht 71.0 in | Wt 153.0 lb

## 2022-10-14 DIAGNOSIS — Z800 Family history of malignant neoplasm of digestive organs: Secondary | ICD-10-CM

## 2022-10-14 DIAGNOSIS — Z86010 Personal history of colonic polyps: Secondary | ICD-10-CM

## 2022-10-14 NOTE — Progress Notes (Signed)
No egg or soy allergy known to patient  No issues known to pt with past sedation with any surgeries or procedures Patient denies ever being told they had issues or difficulty with intubation  No FH of Malignant Hyperthermia Pt is not on diet pills Pt is not on  home 02  Pt is not on blood thinners  Pt denies issues with constipation  No A fib or A flutter Have any cardiac testing pending--no Pt instructed to use Singlecare.com or GoodRx for a price reduction on prep   Sample sheet of over the counter items to purchase for prep sent with packet.   Patient's chart reviewed by John Nulty CNRA prior to previsit and patient appropriate for the [facility].  Previsit completed and red dot placed by patient's name on their procedure day (on provider's schedule).    

## 2022-10-15 ENCOUNTER — Encounter: Payer: Self-pay | Admitting: Internal Medicine

## 2022-10-27 ENCOUNTER — Encounter: Payer: Self-pay | Admitting: Certified Registered Nurse Anesthetist

## 2022-10-28 ENCOUNTER — Encounter: Payer: Self-pay | Admitting: Internal Medicine

## 2022-10-28 ENCOUNTER — Ambulatory Visit (AMBULATORY_SURGERY_CENTER): Payer: 59 | Admitting: Internal Medicine

## 2022-10-28 VITALS — BP 138/67 | HR 45 | Temp 97.1°F | Resp 14 | Ht 71.0 in | Wt 153.0 lb

## 2022-10-28 DIAGNOSIS — Z800 Family history of malignant neoplasm of digestive organs: Secondary | ICD-10-CM

## 2022-10-28 DIAGNOSIS — K5140 Inflammatory polyps of colon without complications: Secondary | ICD-10-CM | POA: Diagnosis not present

## 2022-10-28 DIAGNOSIS — D120 Benign neoplasm of cecum: Secondary | ICD-10-CM

## 2022-10-28 DIAGNOSIS — D124 Benign neoplasm of descending colon: Secondary | ICD-10-CM

## 2022-10-28 DIAGNOSIS — Z86010 Personal history of colonic polyps: Secondary | ICD-10-CM

## 2022-10-28 DIAGNOSIS — Z09 Encounter for follow-up examination after completed treatment for conditions other than malignant neoplasm: Secondary | ICD-10-CM

## 2022-10-28 MED ORDER — SODIUM CHLORIDE 0.9 % IV SOLN: 500.0000 mL | INTRAVENOUS | Status: DC

## 2022-10-28 NOTE — Progress Notes (Signed)
Called to room to assist during endoscopic procedure.  Patient ID and intended procedure confirmed with present staff. Received instructions for my participation in the procedure from the performing physician.  

## 2022-10-28 NOTE — Patient Instructions (Addendum)
I found and removed two tiny polyp sthat look benign.  All else ok.  I will let you know pathology results and when to have another routine colonoscopy by mail and/or My Chart.  I appreciate the opportunity to care for you. Gatha Mayer, MD, FACG   YOU HAD AN ENDOSCOPIC PROCEDURE TODAY AT [facility]:   Refer to the procedure report that was given to you for any specific questions about what was found during the examination.  If the procedure report does not answer your questions, please call your gastroenterologist to clarify.  If you requested that your care partner not be given the details of your procedure findings, then the procedure report has been included in a sealed envelope for you to review at your convenience later.  YOU SHOULD EXPECT: Some feelings of bloating in the abdomen. Passage of more gas than usual.  Walking can help get rid of the air that was put into your GI tract during the procedure and reduce the bloating. If you had a lower endoscopy (such as a colonoscopy or flexible sigmoidoscopy) you may notice spotting of blood in your stool or on the toilet paper. If you underwent a bowel prep for your procedure, you may not have a normal bowel movement for a few days.  Please Note:  You might notice some irritation and congestion in your nose or some drainage.  This is from the oxygen used during your procedure.  There is no need for concern and it should clear up in a day or so.  SYMPTOMS TO REPORT IMMEDIATELY:  Following lower endoscopy (colonoscopy or flexible sigmoidoscopy):  Excessive amounts of blood in the stool  Significant tenderness or worsening of abdominal pains  Swelling of the abdomen that is new, acute  Fever of 100F or higher    For urgent or emergent issues, a gastroenterologist can be reached at any hour by calling [phone]. Do not use MyChart messaging for urgent concerns.    DIET:  We do recommend a small meal at first, but  then you may proceed to your regular diet.  Drink plenty of fluids but you should avoid alcoholic beverages for 24 hours.  ACTIVITY:  You should plan to take it easy for the rest of today and you should NOT DRIVE or use heavy machinery until tomorrow (because of the sedation medicines used during the test).    FOLLOW UP: Our staff will call the number listed on your records the next business day following your procedure.  We will call around 7:15- 8:00 am to check on you and address any questions or concerns that you may have regarding the information given to you following your procedure. If we do not reach you, we will leave a message.     If any biopsies were taken you will be contacted by phone or by letter within the next 1-3 weeks.  Please call us at [phone] if you have not heard about the biopsies in 3 weeks.    SIGNATURES/CONFIDENTIALITY: You and/or your care partner have signed paperwork which will be entered into your electronic medical record.  These signatures attest to the fact that that the information above on your After Visit Summary has been reviewed and is understood.  Full responsibility of the confidentiality of this discharge information lies with you and/or your care-partner.
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
## 2022-10-28 NOTE — Progress Notes (Signed)
Pt's states no medical or surgical changes since previsit or office visit. 

## 2022-10-28 NOTE — Op Note (Signed)
[facility] Patient Name: Willie Hernandez Procedure Date: 10/28/2022 10:09 AM MRN: [id number] Endoscopist: Gatha Mayer , MD, [phone] Age: 67 Referring MD:  Date of Birth: 1955-12-13 Gender: Male Account #: [id number] Procedure:                Colonoscopy Indications:              Surveillance: Personal history of adenomatous                            polyps on last colonoscopy > 5 years ago, Last                            colonoscopy: December 2018 Medicines:                Monitored Anesthesia Care Procedure:                Pre-Anesthesia Assessment:                           - Prior to the procedure, a History and Physical                            was performed, and patient medications and                            allergies were reviewed. The patient's tolerance of                            previous anesthesia was also reviewed. The risks                            and benefits of the procedure and the sedation                            options and risks were discussed with the patient.                            All questions were answered, and informed consent                            was obtained. Prior Anticoagulants: The patient has                            taken no anticoagulant or antiplatelet agents. ASA                            Grade Assessment: II - A patient with mild systemic                            disease. After reviewing the risks and benefits,                            the patient was deemed in satisfactory condition to  undergo the procedure.                           After obtaining informed consent, the colonoscope                            was passed under direct vision. Throughout the                            procedure, the patient's blood pressure, pulse, and                            oxygen saturations were monitored continuously. The                            CF [id number] was introduced  through the anus                            and advanced to the the cecum, identified by                            appendiceal orifice and ileocecal valve. The                            colonoscopy was performed without difficulty. The                            patient tolerated the procedure well. The quality                            of the bowel preparation was good. The ileocecal                            valve, appendiceal orifice, and rectum were                            photographed. The bowel preparation used was                            Miralax via split dose instruction. Scope In: 10:59:48 AM Scope Out: 11:14:52 AM Scope Withdrawal Time: 0 hours 11 minutes 55 seconds  Total Procedure Duration: 0 hours 15 minutes 4 seconds  Findings:                 The perianal and digital rectal examinations were                            normal.                           A 4 mm polyp was found in the proximal descending                            colon. The polyp was sessile. The polyp was removed  with a cold snare. Resection and retrieval were                            complete. Verification of patient identification                            for the specimen was done. Estimated blood loss was                            minimal.                           A 2 mm polyp was found in the ileocecal valve. The                            polyp was sessile. The polyp was removed with a                            piecemeal technique using a cold biopsy forceps.                            Resection and retrieval were complete. Verification                            of patient identification for the specimen was                            done. Estimated blood loss was minimal.                           The exam was otherwise without abnormality on                            direct and retroflexion views. Complications:            No immediate  complications. Estimated Blood Loss:     Estimated blood loss was minimal. Impression:               - One 4 mm polyp in the proximal descending colon,                            removed with a cold snare. Resected and retrieved.                           - One 2 mm polyp at the ileocecal valve, removed                            piecemeal using a cold biopsy forceps. Resected and                            retrieved.                           - The examination was otherwise normal on direct  and retroflexion views.                           - Personal history of colonic polyps. 2 diminutive                            adenomas 06/2017 Recommendation:           - Patient has a contact number available for                            emergencies. The signs and symptoms of potential                            delayed complications were discussed with the                            patient. Return to normal activities tomorrow.                            Written discharge instructions were provided to the                            patient.                           - Resume previous diet.                           - Continue present medications.                           - Repeat colonoscopy is recommended for                            surveillance. The colonoscopy date will be                            determined after pathology results from today's                            exam become available for review. Gatha Mayer, MD 10/28/2022 11:20:25 AM This report has been signed electronically.

## 2022-10-28 NOTE — Progress Notes (Signed)
Report given to PACU, vss 

## 2022-10-29 ENCOUNTER — Telehealth: Payer: Self-pay | Admitting: *Deleted

## 2022-10-29 NOTE — Telephone Encounter (Signed)
  Follow up Call-     10/28/2022   10:05 AM  Call back number  Post procedure Call Back phone  # [phone]  Permission to leave phone message Yes     Patient questions:  Do you have a fever, pain , or abdominal swelling? No. Pain Score  0 *  Have you tolerated food without any problems? Yes.    Have you been able to return to your normal activities? Yes.    Do you have any questions about your discharge instructions: Diet   No. Medications  No. Follow up visit  No.  Do you have questions or concerns about your Care? No.  Actions: * If pain score is 4 or above: No action needed, pain <4.

## 2022-11-09 ENCOUNTER — Encounter: Payer: Self-pay | Admitting: Internal Medicine

## 2022-11-17 ENCOUNTER — Other Ambulatory Visit: Payer: Self-pay | Admitting: Cardiology

## 2023-03-15 ENCOUNTER — Encounter: Payer: Self-pay | Admitting: Cardiology

## 2023-05-31 ENCOUNTER — Encounter: Payer: Self-pay | Admitting: Physician Assistant

## 2023-05-31 NOTE — Progress Notes (Signed)
Cardiology Office Note    Date:  06/03/2023  ID:  Willie Hernandez, Willie Hernandez [birth year], MRN [id number] PCP:  Willie Aspen, MD  Cardiologist:  Willie Schultz, MD  Electrophysiologist:  None   Chief Complaint: f/u CAD  History of Present Illness: .    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 Prior calcium score led to cardiac cath 08/2021 showing moderate LAD/D2 disease, otherwise nonobstructive RCA disease, EF 50-55%, normal filling pressure, recommended for medical therapy with consideration of complex PCI of the LAD if refractory angina. He has done well with this plan.His father died at age 77 in front of him - autopsy not done, had h/o arrhythmia and was on Coumadin with difficulty regulating the level.  He is seen for followup today feeling great without any interim chest pain or dyspnea. He does notice mild fatigue in the afternoons at work. He is exercising 5 days a week and tracks his HR with a smart watch. Average trend has been 45bpm this week. He denies any dizziness or syncope.    Labwork independently reviewed: 03/2022 CBC ok, LDL 54, trig 48, K 3.8, Cr 0.59, alb AST ALT OK 2022 Mg 1.9  ROS: .    Please see the history of present illness.  All other systems are reviewed and otherwise negative.  Studies Reviewed: Marland Kitchen    EKG:  EKG is ordered today, personally reviewed, demonstrating SB 47bpm, LVH with diffuse ST upsloping I, avL, V1-V4 with STD/TWI inferiorly V5-V6 more pronounced than prior  CV Studies: Cardiac studies reviewed are outlined and summarized above. Otherwise please see EMR for full report.   Current Reported Medications:.    Current Meds  Medication Sig   aspirin EC 81 MG tablet Take 81 mg by mouth daily. Swallow whole.   cholecalciferol (VITAMIN D) 1000 UNITS tablet Take 1,000 Units  by mouth daily.   famotidine (PEPCID) 20 MG tablet Take 20 mg by mouth daily as needed for heartburn or indigestion.   fexofenadine (ALLEGRA) 180 MG tablet Take 180 mg by mouth daily as needed for allergies.   fluticasone (FLONASE) 50 MCG/ACT nasal spray Place 2 sprays into both nostrils in the morning.   metoprolol succinate (TOPROL-XL) 25 MG 24 hr tablet TAKE 1 TABLET (25 MG TOTAL) BY MOUTH DAILY.   olopatadine (PATANOL) 0.1 % ophthalmic solution Place 1 drop into both eyes at bedtime.   rosuvastatin (CRESTOR) 20 MG tablet TAKE 1 TABLET BY MOUTH EVERY DAY   vitamin C (ASCORBIC ACID) 500 MG tablet Take 500 mg by mouth daily.    Physical Exam:    VS:  BP 118/72   Pulse (!) 52   Ht 5\' 11"  (1.803 m)   Wt 149 lb 12.8 oz (67.9 kg)   SpO2 99%   BMI 20.89 kg/m    Wt Readings from Last 3 Encounters:  06/03/23 149 lb 12.8 oz (67.9 kg)  10/28/22 153 lb (69.4 kg)  10/14/22 153 lb (69.4 kg)    GEN: Well nourished, well developed in no acute distress NECK: No JVD; No carotid bruits CARDIAC: RRR, no murmurs, rubs, gallops RESPIRATORY:  Clear to auscultation without rales, wheezing or rhonchi  ABDOMEN: Soft, non-tender, non-distended EXTREMITIES:  No edema; No acute deformity   Asessement and Plan:.    1. CAD, abnormal EKG - doing well without angina. His  EKG is noted to be more abnormal today with more pronounced STT changes including ST upsloping in I, avL, I reviewed with Dr. Lynnette Hernandez. In the absence of any cardiac symptoms he is not concerned with this. We will continue with medical therapy to include ASA and rosuvastatin. I will check a troponin today to exclude silent ischemia prompting the change in EKG. Otherwise update basic labs. Will plan for reduction in metoprolol as below. Will obtain baseline echocardiogram to evaluate for structural disease contributing to abnormal EKG.  2. Lipid management - check CMET, lipids today. Continue rosuvastatin 20mg  daily pending re-eval of labs.  Tolerating dose.  3. Baseline sinus bradycardia, LBBB - HR 47bpm by EKG, 52 by intake VS. HR was 49bpm back in 08/2021 so this appears near baseline for him. He reports his most recent trends have shown baseline HR 45bpm. He is generally asymptomatic with this except has noticed mild fatigue in the afternoons. Will reduce Toprol dose to 12.5mg  daily and see how he does. I asked him to notify if HR remains <50bpm on average on this dose or if he begins to have any new symptoms. He is very diligent with home monitoring. Will obtain baseline echo. Recommend to check in with him about HR readings when we relay his echo results.    Disposition: F/u with Dr. Anne Hernandez in 1 year, sooner if testing/VS trend abnormal.  Signed, Laurann Montana, PA-C

## 2023-06-03 ENCOUNTER — Encounter: Payer: Self-pay | Admitting: Physician Assistant

## 2023-06-03 ENCOUNTER — Ambulatory Visit: Payer: 59 | Attending: Physician Assistant | Admitting: Physician Assistant

## 2023-06-03 ENCOUNTER — Ambulatory Visit (HOSPITAL_COMMUNITY): Payer: 59 | Attending: Cardiovascular Disease

## 2023-06-03 VITALS — BP 118/72 | HR 52 | Ht 71.0 in | Wt 149.8 lb

## 2023-06-03 DIAGNOSIS — Z13220 Encounter for screening for lipoid disorders: Secondary | ICD-10-CM | POA: Diagnosis not present

## 2023-06-03 DIAGNOSIS — I447 Left bundle-branch block, unspecified: Secondary | ICD-10-CM | POA: Insufficient documentation

## 2023-06-03 DIAGNOSIS — Z136 Encounter for screening for cardiovascular disorders: Secondary | ICD-10-CM

## 2023-06-03 DIAGNOSIS — R9431 Abnormal electrocardiogram [ECG] [EKG]: Secondary | ICD-10-CM | POA: Diagnosis present

## 2023-06-03 DIAGNOSIS — R001 Bradycardia, unspecified: Secondary | ICD-10-CM

## 2023-06-03 DIAGNOSIS — I2510 Atherosclerotic heart disease of native coronary artery without angina pectoris: Secondary | ICD-10-CM

## 2023-06-03 LAB — CBC
Hematocrit: 44 % (ref 37.5–51.0)
Hemoglobin: 14.2 g/dL (ref 13.0–17.7)
MCH: 31.4 pg (ref 26.6–33.0)
MCHC: 32.3 g/dL (ref 31.5–35.7)
MCV: 97 fL (ref 79–97)
Platelets: 197 10*3/uL (ref 150–450)
RBC: 4.52 x10E6/uL (ref 4.14–5.80)
RDW: 12.4 % (ref 11.6–15.4)
WBC: 4 10*3/uL (ref 3.4–10.8)

## 2023-06-03 LAB — COMPREHENSIVE METABOLIC PANEL
ALT: 40 [IU]/L (ref 0–44)
AST: 40 [IU]/L (ref 0–40)
Albumin: 4.5 g/dL (ref 3.9–4.9)
Alkaline Phosphatase: 42 [IU]/L — ABNORMAL LOW (ref 44–121)
BUN/Creatinine Ratio: 18 (ref 10–24)
BUN: 14 mg/dL (ref 8–27)
Bilirubin Total: 0.7 mg/dL (ref 0.0–1.2)
CO2: 25 mmol/L (ref 20–29)
Calcium: 9.1 mg/dL (ref 8.6–10.2)
Chloride: 104 mmol/L (ref 96–106)
Creatinine, Ser: 0.76 mg/dL (ref 0.76–1.27)
Globulin, Total: 1.5 g/dL (ref 1.5–4.5)
Glucose: 94 mg/dL (ref 70–99)
Potassium: 4.4 mmol/L (ref 3.5–5.2)
Sodium: 141 mmol/L (ref 134–144)
Total Protein: 6 g/dL (ref 6.0–8.5)
eGFR: 97 mL/min/{1.73_m2} (ref >59)

## 2023-06-03 LAB — TROPONIN T: Troponin T (Highly Sensitive): 13 ng/L (ref 0–22)

## 2023-06-03 LAB — ECHOCARDIOGRAM COMPLETE
Area-P 1/2: 3.34 cm2
Height: 71 in
S' Lateral: 3.4 cm
Weight: 2396.8 [oz_av]

## 2023-06-03 LAB — LIPID PANEL
Chol/HDL Ratio: 1.8 ratio (ref 0.0–5.0)
Cholesterol, Total: 154 mg/dL (ref 100–199)
HDL: 87 mg/dL (ref >39)
LDL Chol Calc (NIH): 56 mg/dL (ref 0–99)
Triglycerides: 51 mg/dL (ref 0–149)
VLDL Cholesterol Cal: 11 mg/dL (ref 5–40)

## 2023-06-03 LAB — MAGNESIUM: Magnesium: 2.2 mg/dL (ref 1.6–2.3)

## 2023-06-03 LAB — TSH: TSH: 1.38 u[IU]/mL (ref 0.450–4.500)

## 2023-06-03 MED ORDER — METOPROLOL SUCCINATE ER 25 MG PO TB24: 12.5000 mg | ORAL_TABLET | Freq: Every day | ORAL | 3 refills | Status: DC

## 2023-06-03 NOTE — Patient Instructions (Signed)
Medication Instructions:  Your physician has recommended you make the following change in your medication:  DECREASE TOPROL TO 12.5 MG DAILY.   *If you need a refill on your cardiac medications before your next appointment, please call your pharmacy*   Lab Work: TODAY: CMET, CBC, TSH, MAGNESIUM, STAT TROPONIN, LIPIDS   If you have labs (blood work) drawn today and your tests are completely normal, you will receive your results only by: MyChart Message (if you have MyChart) OR A paper copy in the mail If you have any lab test that is abnormal or we need to change your treatment, we will call you to review the results.   Testing/Procedures: Your physician has requested that you have an echocardiogram. Echocardiography is a painless test that uses sound waves to create images of your heart. It provides your doctor with information about the size and shape of your heart and how well your heart's chambers and valves are working. This procedure takes approximately one hour. There are no restrictions for this procedure. Please do NOT wear cologne, perfume, aftershave, or lotions (deodorant is allowed). Please arrive 15 minutes prior to your appointment time.  Please note: We ask at that you not bring children with you during ultrasound (echo/ vascular) testing. Due to room size and safety concerns, children are not allowed in the ultrasound rooms during exams. Our front office staff cannot provide observation of children in our lobby area while testing is being conducted. An adult accompanying a patient to their appointment will only be allowed in the ultrasound room at the discretion of the ultrasound technician under special circumstances. We apologize for any inconvenience.    Follow-Up: At [facility], you and your health needs are our priority.  As part of our continuing mission to provide you with exceptional heart care, we have created designated Provider Care Teams.  These Care  Teams include your primary Cardiologist (physician) and Advanced Practice Providers (APPs -  Physician Assistants and Nurse Practitioners) who all work together to provide you with the care you need, when you need it.  We recommend signing up for the patient portal called "MyChart".  Sign up information is provided on this After Visit Summary.  MyChart is used to connect with patients for Virtual Visits (Telemedicine).  Patients are able to view lab/test results, encounter notes, upcoming appointments, etc.  Non-urgent messages can be sent to your provider as well.   To learn more about what you can do with MyChart, go to [email or web address].    Your next appointment:   1 year(s)  Provider:   Donato Schultz, MD

## 2023-08-01 ENCOUNTER — Other Ambulatory Visit: Payer: Self-pay | Admitting: Cardiology

## 2023-11-05 ENCOUNTER — Other Ambulatory Visit: Payer: Self-pay | Admitting: Cardiology

## 2024-05-25 NOTE — Progress Notes (Signed)
 Cardiology Office Note    Date:  05/28/2024  ID:  Lupe, Bonner Sep 24, 1955, MRN [id number] PCP:  Charlott Dorn LABOR, MD  Cardiologist:  Oneil Parchment, MD  Electrophysiologist:  None   Chief Complaint: f/u CAD, HR  History of Present Illness: .    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 Prior calcium  score led to cardiac cath 08/2021 showing moderate LAD/D2 disease, otherwise nonobstructive RCA disease, EF 50-55%, normal filling pressure, recommended for medical therapy with consideration of complex PCI of the LAD if refractory angina. His father died at age 38 in front of him - autopsy not done, had h/o arrhythmia and was on Coumadin with difficulty regulating the level. At follow-up in 2024, metoprolol  dose was reduced due to resting bradycardia in the 40s. His EKG had appeared to be more abnormal with ST upsloping in I, avL, and more pronounced STTW changes III, avF, V5-V6, but no associated symptoms. I had reviewed with Dr. Wendel (DOD) who recommended to continue medical therapy. Troponin was negative and 2d echo was reassuring with EF 55-60%, trivial MR, aortic sclerosis without stenosis, no LVH.  He is seen for follow-up today largely doing well. He recently retired a few weeks ago and is staying busy with a to-do list. He has noticed that his average HR trends are more towards upper 40s, but he is not sure whether this is perhaps because he is now finally getting more sleep than he used to, so a longer nocturnal period than pre-retirement. Daytime HRs are mostly in the 50s-60s with occasional outliers into the 40s. He has not had any further wooziness or dizziness. No syncope. When exercising he tries to aim for a target heart rate in the 120s. It can take several minutes to get there. No  clear angina or dyspnea. BP 142/82 with recheck 160/70 by me but he was anxious for today's visit. There was an occasional skip on auscultation. Rhythm strip confirms occasional PVC that he does not feel; this also captured a HR 44bpm (sinus bradycardia without PVCs) while seated. He reports a retired cardiologist friend of his mentioned potentially pursuing a stress test.  Labwork independently reviewed: 05/2023 LDL 56, trig 51, K 4.4, Cr 0.76, LFTs ok, CBC wnl, TSH wnl, Mg wnl, troponin neg  ROS: .    Please see the history of present illness.  All other systems are reviewed and otherwise negative.  Studies Reviewed: SABRA    EKG:  EKG is ordered today, personally reviewed, demonstrating:  EKG Interpretation Date/Time:  Monday May 28 2024 11:00:25 EST Ventricular Rate:  53 PR Interval:  166 QRS Duration:  126 QT Interval:  474 QTC Calculation: 444 R Axis:   38  Text Interpretation: Sinus bradycardia Left ventricular hypertrophy with QRS widening and repolarization abnormality ( Sokolow-Lyon , Cornell product ) Mild ST depression inferiorly and V5-V6 Anterior ST upsloping V2-V4 Similar to previous Confirmed by Zymarion Favorite [phone]) on 05/28/2024 11:12:43 AM    CV Studies: Cardiac studies reviewed are outlined and summarized above. Otherwise please see EMR for full report.   Current Reported Medications:.    Current Meds  Medication Sig   aspirin  EC 81 MG tablet Take 81 mg by mouth daily. Swallow whole.   cholecalciferol  (VITAMIN D ) 1000 UNITS tablet Take 1,000 Units by mouth daily.  famotidine  (PEPCID ) 20 MG tablet Take 20 mg by mouth daily as needed for heartburn or indigestion.   fexofenadine (ALLEGRA) 180 MG tablet Take 180 mg by mouth daily as needed for allergies.   fluticasone  (FLONASE ) 50 MCG/ACT nasal spray Place 2 sprays into both nostrils in the morning.   metoprolol  succinate (TOPROL  XL) 25 MG 24 hr tablet Take 0.5 tablets (12.5 mg total) by mouth daily.    olopatadine (PATANOL) 0.1 % ophthalmic solution Place 1 drop into both eyes at bedtime.   rosuvastatin  (CRESTOR ) 20 MG tablet TAKE 1 TABLET BY MOUTH EVERY DAY   vitamin C  (ASCORBIC ACID ) 500 MG tablet Take 500 mg by mouth daily.    Physical Exam:    VS:  BP (!) 142/82   Pulse (!) 53   Ht 5' 11 (1.803 m)   Wt 148 lb (67.1 kg)   SpO2 98%   BMI 20.64 kg/m    Wt Readings from Last 3 Encounters:  05/28/24 148 lb (67.1 kg)  06/03/23 149 lb 12.8 oz (67.9 kg)  10/28/22 153 lb (69.4 kg)    GEN: Well nourished, well developed in no acute distress NECK: No JVD; No carotid bruits CARDIAC: RRR with rare ectopy, no murmurs, rubs, gallops RESPIRATORY:  Clear to auscultation without rales, wheezing or rhonchi  ABDOMEN: Soft, non-tender, non-distended EXTREMITIES:  No edema; No acute deformity   Asessement and Plan:.    1. CAD, abnormal EKG with borderline LBBB - he has been feeling well. His EKG remains abnormal in a pattern of borderline LBBB but also suggestive of LVH with ST/TW changes. However, his echocardiogram does not show LVH. The patient also raises question of whether he should have a stress test. Discussed with Dr. Jeffrie. Given his underlying sinus bradycardia, new intermittent PVCs, abnormal EKG, family hx of SCD, we will pursue stress MRI for purposes of updated ischemia evaluation as well as exclusion of infiltrative abnormality. I will also update labs today to include CBC, CMET, TSH, Mg, lipid profile.  Informed Consent   Shared Decision Making/Informed Consent The risks [chest pain, shortness of breath, cardiac arrhythmias, dizziness, blood pressure fluctuations, myocardial infarction, stroke/transient ischemic attack, nausea, vomiting, allergic reaction, and life-threatening complications (estimated to be 1 in 10,000)], benefits (risk stratification, diagnosing coronary artery disease, treatment guidance) and alternatives of a MRI stress test were discussed in detail with Mr.  Hemmer and he agrees to proceed.     2 Sinus bradycardia with occasional PVCs - metoprolol  dose reduced in 05/2023 due to sinus bradycardia. HR trends as above. He also sounds to be having occasional PVCs on exam and rhythm strip as well. I considered whether the HR in the 40s could reflect some bradysphygmia, however, a rhythm strip also captured sinus bradycardia at 44bpm. Per shared decision making with the patient, we will stop his metoprolol  12.5mg  daily and observe for now. Will plan 7 day Zio to quantify PVC burden and evaluate heart rate excursion. Plan stress MRI as above (no evidence of AVB seen). Does not seem that there is any evidence to suggest chronotropic incompetence at this time given exercise HRs are OK.   3. Lipid management - Recheck CMET/lipid profile today. Continue rosuvastatin  20mg  daily.  4. Elevated BP without HTN - he reports feeling some anxiety about today's visit, possible component of white coat HTN. Checking labs as above. He will follow his BP at home for 1 week and relay updated readings. Would consider amlodipine as an addition to regimen.  Disposition: F/u with me in 6 weeks.  Signed, Khairi Garman N Husna Krone, PA-C

## 2024-05-28 ENCOUNTER — Ambulatory Visit

## 2024-05-28 ENCOUNTER — Encounter: Payer: Self-pay | Admitting: Physician Assistant

## 2024-05-28 ENCOUNTER — Ambulatory Visit: Attending: Physician Assistant | Admitting: Physician Assistant

## 2024-05-28 ENCOUNTER — Ambulatory Visit: Admitting: Physician Assistant

## 2024-05-28 VITALS — BP 160/70 | HR 53 | Ht 71.0 in | Wt 148.0 lb

## 2024-05-28 DIAGNOSIS — I447 Left bundle-branch block, unspecified: Secondary | ICD-10-CM | POA: Diagnosis not present

## 2024-05-28 DIAGNOSIS — I493 Ventricular premature depolarization: Secondary | ICD-10-CM | POA: Diagnosis present

## 2024-05-28 DIAGNOSIS — R030 Elevated blood-pressure reading, without diagnosis of hypertension: Secondary | ICD-10-CM | POA: Insufficient documentation

## 2024-05-28 DIAGNOSIS — Z136 Encounter for screening for cardiovascular disorders: Secondary | ICD-10-CM | POA: Insufficient documentation

## 2024-05-28 DIAGNOSIS — I2510 Atherosclerotic heart disease of native coronary artery without angina pectoris: Secondary | ICD-10-CM

## 2024-05-28 DIAGNOSIS — Z13220 Encounter for screening for lipoid disorders: Secondary | ICD-10-CM | POA: Insufficient documentation

## 2024-05-28 DIAGNOSIS — R001 Bradycardia, unspecified: Secondary | ICD-10-CM

## 2024-05-28 DIAGNOSIS — R9431 Abnormal electrocardiogram [ECG] [EKG]: Secondary | ICD-10-CM | POA: Insufficient documentation

## 2024-05-28 NOTE — Patient Instructions (Addendum)
 Medication Instructions:  Stop Metprolol *If you need a refill on your cardiac medications before your next appointment, please call your pharmacy*  Lab Work: Today- CMET, MG2, Fasting Lipids, CBC, TSH If you have labs (blood work) drawn today and your tests are completely normal, you will receive your results only by: MyChart Message (if you have MyChart) OR A paper copy in the mail If you have any lab test that is abnormal or we need to change your treatment, we will call you to review the results.  Testing/Procedures: Your provider has recommended you have a Cardiac Stress MRI   You are scheduled for Cardiac MRI at the location below.  Please arrive for your appointment at ______________ . ?  [facility] [address] [place], [place] [address] Please take advantage of the free valet parking available at the [facility] and [employer] (Entrance C).  Proceed to the [facility] Radiology Department (First Floor) for check-in.    Magnetic resonance imaging (MRI) is a painless test that produces images of the inside of the body without using Xrays.  During an MRI, strong magnets and radio waves work together in a data processing manager to form detailed images.   MRI images may provide more details about a medical condition than X-rays, CT scans, and ultrasounds can provide.  You may be given earphones to listen for instructions.  You may eat a light breakfast and take medications as ordered with the exception of furosemide, hydrochlorothiazide, chlorthalidone or spironolactone (or any other fluid pill). If you are undergoing a stress MRI, please avoid stimulants for 12 hr prior to test. (I.e. Caffeine, nicotine, chocolate, or antihistamine medications)  If your provider has ordered anti-anxiety medications for this test, then you will need a driver.  An IV will be inserted into one of your veins. Contrast material will be injected into your IV. It will leave your body  through your urine within a day. You may be told to drink plenty of fluids to help flush the contrast material out of your system.  You will be asked to remove all metal, including: Watch, jewelry, and other metal objects including hearing aids, hair pieces and dentures. Also wearable glucose monitoring systems (ie. Freestyle Libre and Omnipods) (Braces and fillings normally are not a problem.)   TEST WILL TAKE APPROXIMATELY 1 HOUR  PLEASE NOTIFY SCHEDULING AT LEAST 24 HOURS IN ADVANCE IF YOU ARE UNABLE TO KEEP YOUR APPOINTMENT. [phone]  For more information and frequently asked questions, please visit our website : [email or web address]  Please call the Cardiac Imaging Nurse Navigators with any questions/concerns. [phone] Office    ZIO XT- Long Term Monitor Instructions  Your physician has requested you wear a ZIO patch monitor for 7 days.  This is a single patch monitor. [employer] supplies one patch monitor per enrollment. Additional stickers are not available. Please do not apply patch if you will be having a Nuclear Stress Test,  Echocardiogram, Cardiac CT, MRI, or Chest Xray during the period you would be wearing the  monitor. The patch cannot be worn during these tests. You cannot remove and re-apply the  ZIO XT patch monitor.  Billing and Patient Assistance Program Information  We have supplied [employer] with any of your insurance information on file for billing purposes. [employer] offers a sliding scale Patient Assistance Program for patients that do not have  insurance, or whose insurance does not completely cover the cost of the ZIO monitor.  You must apply for the Patient Assistance  Program to qualify for this discounted rate.  To apply, please call [employer] at [phone], select option 4, select option 2, ask to apply for  Patient Assistance Program. Meredeth will ask your household income, and how many people  are in your household. They will quote your  out-of-pocket cost based on that information.  [employer] will also be able to set up a 74-month, interest-free payment plan if needed.  When you are ready to remove the patch, follow instructions on the last 2 pages of Patient  Logbook. Stick patch monitor onto the last page of Patient Logbook.  Place Patient Logbook in the blue and white box. Use locking tab on box and tape box closed  securely. The blue and white box has prepaid postage on it. Please place it in the mailbox as  soon as possible. Your physician should have your test results approximately 7 days after the  monitor has been mailed back to [facility].  Call [facility] Customer Care at [phone] if you have questions regarding  your ZIO XT patch monitor. Call them immediately if you see an orange light blinking on your  monitor.  If your monitor falls off in less than 4 days, contact our Monitor department at [phone].  If your monitor becomes loose or falls off after 4 days call [employer] at [phone] for  suggestions on securing your monitor    We need to get a better idea of what your blood pressure is running at home. Here are some instructions to follow: - I would recommend using a blood pressure cuff that goes on your arm. The wrist ones can be inaccurate. If you're purchasing one for the first time, try to select one that also reports your heart rate because this can be helpful information as well. - To check your blood pressure, choose a time at least 3 hours after taking your blood pressure medicines. If you can sample it at different times of the day, that's great - it might give you more information about how your blood pressure fluctuates. Remain seated in a chair for 5 minutes quietly beforehand, then check it.  - Please record a list of those readings and call us /send in MyChart message with them for our review in 1 week.   Follow-Up: At [facility], you and your health needs are our  priority.  As part of our continuing mission to provide you with exceptional heart care, our providers are all part of one team.  This team includes your primary Cardiologist (physician) and Advanced Practice Providers or APPs (Physician Assistants and Nurse Practitioners) who all work together to provide you with the care you need, when you need it.  Your next appointment:   6 week(s)  Provider:   Dayna Dunn, PA-C

## 2024-05-28 NOTE — Progress Notes (Unsigned)
 Enrolled patient for a 7 day Zio XT monitor to be mailed to patients home   Skains to read

## 2024-05-29 ENCOUNTER — Ambulatory Visit: Payer: Self-pay | Admitting: Physician Assistant

## 2024-05-29 LAB — CBC
Hematocrit: 42.8 % (ref 37.5–51.0)
Hemoglobin: 14.2 g/dL (ref 13.0–17.7)
MCH: 32.1 pg (ref 26.6–33.0)
MCHC: 33.2 g/dL (ref 31.5–35.7)
MCV: 97 fL (ref 79–97)
Platelets: 192 x10E3/uL (ref 150–450)
RBC: 4.42 x10E6/uL (ref 4.14–5.80)
RDW: 12.5 % (ref 11.6–15.4)
WBC: 3.9 x10E3/uL (ref 3.4–10.8)

## 2024-05-29 LAB — LIPID PANEL
Chol/HDL Ratio: 1.8 ratio (ref 0.0–5.0)
Cholesterol, Total: 153 mg/dL (ref 100–199)
HDL: 84 mg/dL (ref >39)
LDL Chol Calc (NIH): 58 mg/dL (ref 0–99)
Triglycerides: 48 mg/dL (ref 0–149)
VLDL Cholesterol Cal: 11 mg/dL (ref 5–40)

## 2024-05-29 LAB — COMPREHENSIVE METABOLIC PANEL WITH GFR
ALT: 24 IU/L (ref 0–44)
AST: 30 IU/L (ref 0–40)
Albumin: 4 g/dL (ref 3.9–4.9)
Alkaline Phosphatase: 35 IU/L — ABNORMAL LOW (ref 47–123)
BUN/Creatinine Ratio: 19 (ref 10–24)
BUN: 12 mg/dL (ref 8–27)
Bilirubin Total: 0.6 mg/dL (ref 0.0–1.2)
CO2: 25 mmol/L (ref 20–29)
Calcium: 9 mg/dL (ref 8.6–10.2)
Chloride: 105 mmol/L (ref 96–106)
Creatinine, Ser: 0.63 mg/dL — ABNORMAL LOW (ref 0.76–1.27)
Globulin, Total: 1.6 g/dL (ref 1.5–4.5)
Glucose: 83 mg/dL (ref 70–99)
Potassium: 4.3 mmol/L (ref 3.5–5.2)
Sodium: 141 mmol/L (ref 134–144)
Total Protein: 5.6 g/dL — ABNORMAL LOW (ref 6.0–8.5)
eGFR: 102 mL/min/1.73 (ref >59)

## 2024-05-29 LAB — MAGNESIUM: Magnesium: 2.4 mg/dL — ABNORMAL HIGH (ref 1.6–2.3)

## 2024-05-29 LAB — TSH: TSH: 0.839 u[IU]/mL (ref 0.450–4.500)

## 2024-06-04 ENCOUNTER — Other Ambulatory Visit (HOSPITAL_COMMUNITY): Payer: Self-pay | Admitting: Emergency Medicine

## 2024-06-04 ENCOUNTER — Telehealth (HOSPITAL_COMMUNITY): Payer: Self-pay | Admitting: Emergency Medicine

## 2024-06-04 ENCOUNTER — Encounter (HOSPITAL_COMMUNITY): Payer: Self-pay

## 2024-06-04 DIAGNOSIS — I25119 Atherosclerotic heart disease of native coronary artery with unspecified angina pectoris: Secondary | ICD-10-CM

## 2024-06-04 NOTE — Telephone Encounter (Signed)
 Reaching out to patient to offer assistance regarding upcoming cardiac imaging study; pt verbalizes understanding of appt date/time, parking situation and where to check in, pre-test NPO status and medications ordered, and verified current allergies; name and call back number provided for further questions should they arise Camie Shutter RN Navigator Cardiac Imaging [facility] [phone] office [phone] cell  Explained Stress MRI and what to expect Understands to arrive 9:15 for pre-EKG

## 2024-06-06 ENCOUNTER — Other Ambulatory Visit: Payer: Self-pay | Admitting: Physician Assistant

## 2024-06-06 ENCOUNTER — Ambulatory Visit (HOSPITAL_COMMUNITY)
Admission: RE | Admit: 2024-06-06 | Discharge: 2024-06-06 | Disposition: A | Discharge status: Home / Self Care | Source: Ambulatory Visit | Attending: Cardiology | Admitting: Cardiology

## 2024-06-06 ENCOUNTER — Encounter: Payer: Self-pay | Admitting: Cardiology

## 2024-06-06 ENCOUNTER — Other Ambulatory Visit: Payer: Self-pay

## 2024-06-06 ENCOUNTER — Ambulatory Visit (HOSPITAL_COMMUNITY)
Admission: RE | Admit: 2024-06-06 | Discharge: 2024-06-06 | Disposition: A | Discharge status: Home / Self Care | Source: Ambulatory Visit | Attending: Physician Assistant | Admitting: Physician Assistant

## 2024-06-06 DIAGNOSIS — R9431 Abnormal electrocardiogram [ECG] [EKG]: Secondary | ICD-10-CM

## 2024-06-06 DIAGNOSIS — I25119 Atherosclerotic heart disease of native coronary artery with unspecified angina pectoris: Secondary | ICD-10-CM | POA: Insufficient documentation

## 2024-06-06 MED ORDER — REGADENOSON 0.4 MG/5ML IV SOLN
0.4000 mg | Freq: Once | INTRAVENOUS | Status: AC
  Administered 2024-06-06: 0.4 mg via INTRAVENOUS
  Filled 2024-06-06: qty 5

## 2024-06-06 MED ORDER — REGADENOSON 0.4 MG/5ML IV SOLN
INTRAVENOUS | Status: AC
  Filled 2024-06-06: qty 5

## 2024-06-06 MED ORDER — GADOBUTROL 1 MMOL/ML IV SOLN: 10.0000 mL | Freq: Once | INTRAVENOUS | Status: DC | PRN

## 2024-06-06 NOTE — Progress Notes (Signed)
 Patient presents for stress MRI.  BP 121/59, HR 44bpm.  No caffeine intake in prior 12 hours. No wheezing on exam.  EKG today shows sinus bradycardia with PAC, rate 44, LVH   Shared Decision Making/Informed Consent The risks [chest pain, shortness of breath, cardiac arrhythmias, dizziness, blood pressure fluctuations, myocardial infarction, stroke/transient ischemic attack, nausea, vomiting, allergic reaction, and life-threatening complications (estimated to be 1 in 10,000)], benefits (risk stratification, diagnosing coronary artery disease, treatment guidance) and alternatives of a MRI stress test were discussed in detail with patient and they agree to proceed.

## 2024-07-01 DIAGNOSIS — R001 Bradycardia, unspecified: Secondary | ICD-10-CM

## 2024-07-09 ENCOUNTER — Other Ambulatory Visit: Payer: Self-pay

## 2024-07-11 MED ORDER — ROSUVASTATIN CALCIUM 20 MG PO TABS: 20.0000 mg | ORAL_TABLET | Freq: Every day | ORAL | 3 refills | Status: AC

## 2024-07-30 NOTE — Progress Notes (Signed)
 "  Cardiology Office Note    Date:  07/31/2024  ID:  Willie Hernandez 05/29/1956, MRN [id number] PCP:  Charlott Dorn LABOR, MD  Cardiologist:  Oneil Parchment, MD  Electrophysiologist:  None   Chief Complaint: f/u PVCs, bradycardia, testing  History of Present Illness: .    Willie Hernandez is a 69 y.o. male with visit-pertinent history of retired programme researcher, broadcasting/film/video with visit-pertinent history of CAD, sinus bradycardia, borderline LBBB, PVCs, GERD, retinal detachment, family history of heart disease in his father who is seen for annual follow-up.   Prior calcium  score led to cardiac cath 08/2021 showing moderate LAD/D2 disease, otherwise nonobstructive RCA disease, EF 50-55%, normal filling pressure, recommended for medical therapy with consideration of complex PCI of the LAD if refractory angina. His father died at age 72 in front of him - autopsy not done, had h/o arrhythmia and was on Coumadin with difficulty regulating the level. At follow-up in 2024, metoprolol  dose was reduced due to resting bradycardia in the 40s. His EKG had appeared to be more abnormal with ST upsloping in I, avL, and more pronounced STTW changes III, avF, V5-V6, but no associated symptoms. I had reviewed with Dr. Wendel (DOD) who recommended to continue medical therapy. Troponin was negative and 2d echo was reassuring with EF 55-60%, trivial MR, aortic sclerosis without stenosis, no LVH. More recently he was evaluated in 05/2024 with occasional PVCs. He also had continued episodic bradycardia in the 40s so metoprolol  12.5mg  daily was stopped. He also inquired about updated stress testing. Per discussion with Dr. Parchment, we pursued cMRI stress which showed poor quality stress perfusion images but possible suggestion of defect in basal inferoseptal wall (?possible ischemia), as well as basal inferolateral midwall LGE (nonischemic scar pattern location commonly seen in prior myocarditis), LVEF 58% with mild LV dilation,  moderate RV dilation, moderate biatrial enlargement. I discussed results with Dr. Parchment who felt that medical management was appropriate in the absence of symptoms. I advised sleep study for the RV changes. Zio was completed off metoprolol  showing average HR 60bpm, 3.6% PVCs, rare PACs, brief episodes of atrial tach (longest 10 beats), minimum HR 41bpm, 15% of beats <50bpm.  He returns for follow-up today with his wife. They both feel he is doing very well overall. He continues to exercise regularly at the gym with no CP, dizziness, SOB, palpitations, edema, or syncope. His wife does note audible snoring. Retrospectively he does note some occasional fatigue especially after lunchtime.    Labwork independently reviewed: 05/2024 K 4.3, Cr 0.63, AST ALT OK, TSH OK, LDL 58, trig 48, Mg 2.4, CBC OK  ROS: .    Please see the history of present illness.   All other systems are reviewed and otherwise negative.  Studies Reviewed: SABRA    EKG:  EKG is not ordered today  CV Studies: Cardiac studies reviewed are outlined and summarized above. Otherwise please see EMR for full report.   Current Reported Medications:.    Active Medications[1]  Physical Exam:    VS:  BP 136/68   Pulse (!) 52   Ht 5' 11 (1.803 m)   Wt 150 lb 6.4 oz (68.2 kg)   SpO2 99%   BMI 20.98 kg/m    Wt Readings from Last 3 Encounters:  07/31/24 150 lb 6.4 oz (68.2 kg)  05/28/24 148 lb (67.1 kg)  06/03/23 149 lb 12.8 oz (67.9 kg)    GEN: Well nourished, well developed in no acute distress NECK: No  JVD; No carotid bruits CARDIAC: RRR, no murmurs, rubs, gallops RESPIRATORY:  Clear to auscultation without rales, wheezing or rhonchi  ABDOMEN: Soft, non-tender, non-distended EXTREMITIES:  No edema; No acute deformity   Asessement and Plan:.    1. CAD, abnormal stress test, borderline LBBB - had discussed MRI results with Dr. Jeffrie. In the absence of symptoms, continued medical therapy is appropriate. He is exercising  regularly without angina and feeling great. LVEF is preserved. Continue aspirin  81mg  daily, rosuvastatin  20mg  daily. No longer on beta blocker d/t bradycardia.  2. Sinus bradycardia with occasional PVCs - HR stable off metoprolol . PVC burden was fairly low. In the absence of symptoms, surveillance is appropriate. We discussed that if he develops symptoms with palpitations, we could refer to EP to discuss options for treatment.   3. Elevated BP without diagnosis of HTN - home BPs have been normal. He will continue to keep an eye on this and notify if SBP >130.  4. Hypermagnesemia - noted on last labs. Suspect lab error. Repeat today.   5. Snoring, RV dilation - I was informed our sleep study process is on hold due to backlog. Will proceed with referral to pulmonary for evaluation of sleep apnea. No s/sx of RHF at this time.    Disposition: F/u with Dr. Jeffrie or myself in 6 months.  Signed, Arvil Utz N Demarqus Jocson, PA-C      [1]  Current Meds  Medication Sig   aspirin  EC 81 MG tablet Take 81 mg by mouth daily. Swallow whole.   cholecalciferol  (VITAMIN D ) 1000 UNITS tablet Take 1,000 Units by mouth daily.   famotidine  (PEPCID ) 20 MG tablet Take 20 mg by mouth daily as needed for heartburn or indigestion.   fexofenadine (ALLEGRA) 180 MG tablet Take 180 mg by mouth daily as needed for allergies.   fluticasone  (FLONASE ) 50 MCG/ACT nasal spray Place 2 sprays into both nostrils in the morning.   olopatadine (PATANOL) 0.1 % ophthalmic solution Place 1 drop into both eyes at bedtime.   rosuvastatin  (CRESTOR ) 20 MG tablet Take 1 tablet (20 mg total) by mouth daily.   vitamin C  (ASCORBIC ACID ) 500 MG tablet Take 500 mg by mouth daily.   "

## 2024-07-31 ENCOUNTER — Ambulatory Visit: Attending: Cardiology | Admitting: Physician Assistant

## 2024-07-31 ENCOUNTER — Encounter: Payer: Self-pay | Admitting: Physician Assistant

## 2024-07-31 VITALS — BP 136/68 | HR 52 | Ht 71.0 in | Wt 150.4 lb

## 2024-07-31 DIAGNOSIS — R403 Persistent vegetative state: Secondary | ICD-10-CM | POA: Diagnosis not present

## 2024-07-31 DIAGNOSIS — R0683 Snoring: Secondary | ICD-10-CM | POA: Insufficient documentation

## 2024-07-31 DIAGNOSIS — I2510 Atherosclerotic heart disease of native coronary artery without angina pectoris: Secondary | ICD-10-CM | POA: Diagnosis not present

## 2024-07-31 DIAGNOSIS — R030 Elevated blood-pressure reading, without diagnosis of hypertension: Secondary | ICD-10-CM | POA: Insufficient documentation

## 2024-07-31 DIAGNOSIS — R001 Bradycardia, unspecified: Secondary | ICD-10-CM | POA: Insufficient documentation

## 2024-07-31 NOTE — Patient Instructions (Signed)
 Medication Instructions:  Your physician recommends that you continue on your current medications as directed. Please refer to the Current Medication list given to you today.  *If you need a refill on your cardiac medications before your next appointment, please call your pharmacy*  Lab Work: Today- Mg2 If you have labs (blood work) drawn today and your tests are completely normal, you will receive your results only by: MyChart Message (if you have MyChart) OR A paper copy in the mail If you have any lab test that is abnormal or we need to change your treatment, we will call you to review the results.   Follow-Up: At [facility], you and your health needs are our priority.  As part of our continuing mission to provide you with exceptional heart care, our providers are all part of one team.  This team includes your primary Cardiologist (physician) and Advanced Practice Providers or APPs (Physician Assistants and Nurse Practitioners) who all work together to provide you with the care you need, when you need it.  Your next appointment:   6 month(s)  Provider:   Oneil Parchment, MD or Dayna Dunn, PA-C          We recommend signing up for the patient portal called MyChart.  Sign up information is provided on this After Visit Summary.  MyChart is used to connect with patients for Virtual Visits (Telemedicine).  Patients are able to view lab/test results, encounter notes, upcoming appointments, etc.  Non-urgent messages can be sent to your provider as well.   To learn more about what you can do with MyChart, go to [email or web address].   Other Instructions Referral sent to Pulmonary for sleep study

## 2024-08-01 ENCOUNTER — Ambulatory Visit: Payer: Self-pay | Admitting: Physician Assistant

## 2024-08-01 LAB — MAGNESIUM: Magnesium: 2.1 mg/dL (ref 1.6–2.3)

## 2024-08-28 ENCOUNTER — Encounter: Payer: Self-pay | Admitting: Primary Care

## 2024-08-28 ENCOUNTER — Ambulatory Visit: Admitting: Primary Care

## 2024-08-28 VITALS — BP 130/80 | HR 58 | Ht 71.0 in | Wt 150.2 lb

## 2024-08-28 DIAGNOSIS — R0683 Snoring: Secondary | ICD-10-CM

## 2024-10-23 ENCOUNTER — Ambulatory Visit: Admitting: Primary Care
